# Patient Record
Sex: Male | Born: 1980 | Race: White | Hispanic: No | Smoking: Current every day smoker
Health system: Southern US, Community
[De-identification: ages and names within clinical notes are randomized; demographics above are authoritative.]

## PROBLEM LIST (undated history)

## (undated) DIAGNOSIS — J9811 Atelectasis: Secondary | ICD-10-CM

## (undated) DIAGNOSIS — S2239XA Fracture of one rib, unspecified side, initial encounter for closed fracture: Secondary | ICD-10-CM

## (undated) DIAGNOSIS — K029 Dental caries, unspecified: Secondary | ICD-10-CM

## (undated) DIAGNOSIS — Z9889 Other specified postprocedural states: Secondary | ICD-10-CM

## (undated) DIAGNOSIS — Z9289 Personal history of other medical treatment: Secondary | ICD-10-CM

## (undated) DIAGNOSIS — S2249XA Multiple fractures of ribs, unspecified side, initial encounter for closed fracture: Secondary | ICD-10-CM

## (undated) HISTORY — DX: Dental caries, unspecified: K02.9

## (undated) HISTORY — DX: Other specified postprocedural states: Z98.890

## (undated) HISTORY — DX: Fracture of one rib, unspecified side, initial encounter for closed fracture: S22.39XA

## (undated) HISTORY — DX: Atelectasis: J98.11

## (undated) HISTORY — DX: Multiple fractures of ribs, unspecified side, initial encounter for closed fracture: S22.49XA

## (undated) HISTORY — PX: SPLENECTOMY, TOTAL: SHX788

## (undated) HISTORY — PX: APPENDECTOMY: SHX54

## (undated) HISTORY — DX: Personal history of other medical treatment: Z92.89

---

## 2008-05-03 ENCOUNTER — Emergency Department: Payer: Self-pay | Admitting: Unknown Physician Specialty

## 2008-11-14 ENCOUNTER — Emergency Department: Payer: Self-pay | Admitting: Emergency Medicine

## 2008-12-07 ENCOUNTER — Emergency Department: Payer: Self-pay | Admitting: Emergency Medicine

## 2009-03-10 ENCOUNTER — Emergency Department: Payer: Self-pay | Admitting: Internal Medicine

## 2009-03-16 ENCOUNTER — Emergency Department: Payer: Self-pay | Admitting: Emergency Medicine

## 2009-05-17 ENCOUNTER — Emergency Department: Payer: Self-pay | Admitting: Internal Medicine

## 2009-09-20 ENCOUNTER — Emergency Department: Payer: Self-pay | Admitting: Emergency Medicine

## 2009-09-28 ENCOUNTER — Emergency Department: Payer: Self-pay | Admitting: Emergency Medicine

## 2009-10-27 ENCOUNTER — Emergency Department: Payer: Self-pay | Admitting: Emergency Medicine

## 2010-01-21 ENCOUNTER — Emergency Department: Payer: Self-pay | Admitting: Internal Medicine

## 2010-04-29 ENCOUNTER — Emergency Department: Payer: Self-pay | Admitting: Emergency Medicine

## 2010-05-05 ENCOUNTER — Emergency Department: Payer: Self-pay | Admitting: Emergency Medicine

## 2010-07-31 ENCOUNTER — Emergency Department: Payer: Self-pay | Admitting: Emergency Medicine

## 2010-09-27 ENCOUNTER — Emergency Department: Payer: Self-pay | Admitting: Emergency Medicine

## 2011-01-31 ENCOUNTER — Emergency Department (HOSPITAL_COMMUNITY)
Admission: EM | Admit: 2011-01-31 | Discharge: 2011-02-01 | Disposition: A | Discharge status: Home / Self Care | Payer: Self-pay | Attending: Emergency Medicine | Admitting: Emergency Medicine

## 2011-01-31 DIAGNOSIS — K029 Dental caries, unspecified: Secondary | ICD-10-CM | POA: Insufficient documentation

## 2011-01-31 DIAGNOSIS — K089 Disorder of teeth and supporting structures, unspecified: Secondary | ICD-10-CM | POA: Insufficient documentation

## 2011-03-08 ENCOUNTER — Emergency Department (HOSPITAL_COMMUNITY)
Admission: EM | Admit: 2011-03-08 | Discharge: 2011-03-08 | Disposition: A | Discharge status: Home / Self Care | Payer: Self-pay | Attending: Emergency Medicine | Admitting: Emergency Medicine

## 2011-03-08 DIAGNOSIS — I10 Essential (primary) hypertension: Secondary | ICD-10-CM | POA: Insufficient documentation

## 2011-03-08 DIAGNOSIS — K089 Disorder of teeth and supporting structures, unspecified: Secondary | ICD-10-CM | POA: Insufficient documentation

## 2011-03-08 DIAGNOSIS — K029 Dental caries, unspecified: Secondary | ICD-10-CM | POA: Insufficient documentation

## 2011-03-24 ENCOUNTER — Emergency Department: Payer: Self-pay | Admitting: Emergency Medicine

## 2011-03-25 ENCOUNTER — Emergency Department: Payer: Self-pay | Admitting: Unknown Physician Specialty

## 2011-03-28 ENCOUNTER — Emergency Department: Payer: Self-pay | Admitting: *Deleted

## 2011-03-29 ENCOUNTER — Emergency Department: Payer: Self-pay | Admitting: Internal Medicine

## 2011-03-30 ENCOUNTER — Emergency Department: Payer: Self-pay | Admitting: Internal Medicine

## 2011-04-10 ENCOUNTER — Emergency Department (HOSPITAL_COMMUNITY)
Admission: EM | Admit: 2011-04-10 | Discharge: 2011-04-10 | Discharge status: Against Medical Advice | Payer: Self-pay | Attending: Emergency Medicine | Admitting: Emergency Medicine

## 2011-04-10 ENCOUNTER — Encounter: Payer: Self-pay | Admitting: *Deleted

## 2011-04-10 ENCOUNTER — Emergency Department (HOSPITAL_COMMUNITY): Payer: Self-pay

## 2011-04-10 DIAGNOSIS — M549 Dorsalgia, unspecified: Secondary | ICD-10-CM

## 2011-04-10 DIAGNOSIS — Z0389 Encounter for observation for other suspected diseases and conditions ruled out: Secondary | ICD-10-CM | POA: Insufficient documentation

## 2011-04-10 LAB — URINALYSIS, ROUTINE W REFLEX MICROSCOPIC
Bilirubin Urine: NEGATIVE
Glucose, UA: NEGATIVE mg/dL
Ketones, ur: NEGATIVE mg/dL
Leukocytes, UA: NEGATIVE
Nitrite: NEGATIVE
Protein, ur: NEGATIVE mg/dL
Specific Gravity, Urine: 1.035 — ABNORMAL HIGH (ref 1.005–1.030)
Urobilinogen, UA: 0.2 mg/dL (ref 0.0–1.0)
pH: 6.5 (ref 5.0–8.0)

## 2011-04-10 LAB — URINE MICROSCOPIC-ADD ON

## 2011-04-10 MED ORDER — DIPHENHYDRAMINE HCL 50 MG/ML IJ SOLN: 25.0000 mg | Freq: Once | INTRAMUSCULAR | Status: DC

## 2011-04-10 MED ORDER — SODIUM CHLORIDE 0.9 % IV SOLN: 4.0000 mg | Freq: Once | INTRAVENOUS | Status: DC

## 2011-04-10 MED ORDER — MORPHINE SULFATE 4 MG/ML IJ SOLN
4.0000 mg | Freq: Once | INTRAMUSCULAR | Status: AC
  Administered 2011-04-10: 4 mg via INTRAVENOUS
  Filled 2011-04-10: qty 1

## 2011-04-10 MED ORDER — KETOROLAC TROMETHAMINE 30 MG/ML IJ SOLN
30.0000 mg | Freq: Once | INTRAMUSCULAR | Status: AC
  Administered 2011-04-10: 30 mg via INTRAVENOUS
  Filled 2011-04-10: qty 1

## 2011-04-10 MED ORDER — ONDANSETRON HCL 4 MG/2ML IJ SOLN
INTRAMUSCULAR | Status: AC
  Administered 2011-04-10: 19:00:00
  Filled 2011-04-10: qty 2

## 2011-04-10 MED ORDER — DIPHENHYDRAMINE HCL 50 MG/ML IJ SOLN
INTRAMUSCULAR | Status: AC
  Administered 2011-04-10: 19:00:00
  Filled 2011-04-10: qty 1

## 2011-04-10 MED ORDER — ONDANSETRON HCL 4 MG/2ML IJ SOLN
4.0000 mg | Freq: Once | INTRAMUSCULAR | Status: AC
  Administered 2011-04-10: 4 mg via INTRAVENOUS

## 2011-04-10 NOTE — ED Provider Notes (Signed)
History     CSN: [id number] Arrival date & time: 04/10/2011 12:19 PM   First MD Initiated Contact with Patient 04/10/11 1408      No chief complaint on file.   (Consider location/radiation/quality/duration/timing/severity/associated sxs/prior treatment) Patient is a 30 y.o. male presenting with dysuria.  Dysuria     History reviewed. No pertinent past medical history.  Past Surgical History  Procedure Date  . Splenectomy, total     History reviewed. No pertinent family history.  History  Substance Use Topics  . Smoking status: Current Everyday Smoker -- 1.0 packs/day  . Smokeless tobacco: Not on file  . Alcohol Use: No      Review of Systems  Genitourinary: Positive for dysuria.    Allergies  Review of patient's allergies indicates no known allergies.  Home Medications  No current outpatient prescriptions on file.  BP 144/99  Pulse 117  Temp(Src) 98.3 F (36.8 C) (Oral)  Resp 18  Ht 5\' 11"  (1.803 m)  Wt 145 lb (65.772 kg)  BMI 20.22 kg/m2  SpO2 100%  Physical Exam  ED Course  Procedures (including critical care time)  Labs Reviewed - No data to display No results found.   No diagnosis found.    MDM    Pt not seen, left ama           Maejor Erven A. Patrica Duel, MD 04/13/11 1454

## 2011-04-10 NOTE — ED Notes (Signed)
Pt left without IV being removed. It is unknown if pt removed IV prior to discharge. PA aware of pt AMA and unknown removal of IV

## 2011-04-10 NOTE — ED Notes (Signed)
Family at bedside. 

## 2011-04-10 NOTE — ED Notes (Signed)
Attempted to introduce self to pt. Pt left without receiving discharge instructions and without being properly discharged.

## 2011-04-10 NOTE — ED Notes (Signed)
Patient is resting comfortably. 

## 2011-04-10 NOTE — ED Provider Notes (Signed)
History     CSN: [id number] Arrival date & time: 04/10/2011 12:19 PM   First MD Initiated Contact with Patient 04/10/11 1408      No chief complaint on file.   (Consider location/radiation/quality/duration/timing/severity/associated sxs/prior treatment) HPI Comments: Patient reports the began having left flank pain earlier today.  The pain radiates to his left groin.  Pain is intermittent.  He has a family history of kidney stones.  Patient is a 30 y.o. male presenting with dysuria.  Dysuria  This is a new problem. The current episode started 6 to 12 hours ago. The problem occurs intermittently. The problem has not changed since onset.The quality of the pain is described as burning. There has been no fever. He is sexually active. There is no history of pyelonephritis. Associated symptoms include flank pain. Pertinent negatives include no chills, no nausea, no vomiting, no frequency, no hematuria, no hesitancy and no urgency. He has tried NSAIDs for the symptoms. His past medical history does not include kidney stones, urological procedure or recurrent UTIs.    History reviewed. No pertinent past medical history.  Past Surgical History  Procedure Date  . Splenectomy, total     History reviewed. No pertinent family history.  History  Substance Use Topics  . Smoking status: Current Everyday Smoker -- 1.0 packs/day  . Smokeless tobacco: Not on file  . Alcohol Use: No      Review of Systems  Constitutional: Negative for fever, chills, diaphoresis and appetite change.  Cardiovascular: Negative for chest pain.  Gastrointestinal: Negative for nausea, vomiting and abdominal distention.  Genitourinary: Positive for dysuria and flank pain. Negative for hesitancy, urgency, frequency, hematuria, discharge, penile swelling, scrotal swelling, difficulty urinating, genital sores, penile pain and testicular pain.  Musculoskeletal: Negative for arthralgias.  Hematological: Negative for  adenopathy.    Allergies  Tramadol and Morphine and related  Home Medications   Current Outpatient Rx  Name Route Sig Dispense Refill  . IBUPROFEN 200 MG PO TABS Oral Take 400 mg by mouth 3 (three) times daily as needed. For pain        BP 130/80  Pulse 79  Temp(Src) 98.4 F (36.9 C) (Oral)  Resp 18  Ht 5\' 11"  (1.803 m)  Wt 145 lb (65.772 kg)  BMI 20.22 kg/m2  SpO2 99%  Physical Exam  Constitutional: He appears well-developed and well-nourished.       Uncomfortable appearing.  HENT:  Head: Normocephalic and atraumatic.  Eyes: EOM are normal.  Neck: Normal range of motion.  Cardiovascular: Normal rate, regular rhythm and normal heart sounds.   No murmur heard. Pulmonary/Chest: Effort normal and breath sounds normal.  Abdominal: Soft. He exhibits no distension and no mass. There is no hepatosplenomegaly. There is tenderness in the left lower quadrant. There is CVA tenderness. There is no rigidity, no rebound, no guarding and no tenderness at McBurney's point. No hernia.       Left sided CVA tenderness. No right CVA tenderness.    ED Course  Procedures (including critical care time)  Labs Reviewed  URINALYSIS, ROUTINE W REFLEX MICROSCOPIC - Abnormal; Notable for the following:    Specific Gravity, Urine 1.035 (*)    Hgb urine dipstick SMALL (*)    All other components within normal limits  URINE MICROSCOPIC-ADD ON   Ct Abdomen Pelvis Wo Contrast  04/10/2011  *RADIOLOGY REPORT*  Clinical Data: Left flank pain, hematuria.  CT ABDOMEN AND PELVIS WITHOUT CONTRAST  Technique:  Multidetector CT imaging of the abdomen and  pelvis was performed following the standard protocol without intravenous contrast.  Comparison: None.  Findings: Scarring noted in the lung bases.  No effusions.  Heart is normal size.  Gallstones layering within the gallbladder.  Liver, pancreas, adrenals and kidneys have an unremarkable unenhanced appearance. There may be several accessory spleens in the  left upper quadrant. Surgical clips or coils are seen in the left upper quadrant as well.  No ureteral stones.  No hydronephrosis.  Urinary bladder is unremarkable.  Appendix is visualized and is normal. Bowel grossly unremarkable.  No free fluid, free air, or adenopathy.  Aorta is normal caliber.  IMPRESSION: No renal or ureteral stones.  No hydronephrosis.  No acute findings.  Original Report Authenticated By: Cyndie Chime, M.D.     1. Back pain   2.  Hematuria  Discussed results of the CT scan with patient.  While I was finishing patient's discharge paperwork he left AMA.      MDM  Patient having signs and symptoms of kidney stones.  Small hemoglobin present in the urine.  Therefore, a CT abdomen w/o contrast was ordered to rule out stones.  No stones observed on CT.          Jon Stafford 04/14/11 0120

## 2011-04-10 NOTE — ED Notes (Addendum)
Pt waiting to be seen by provider. Pt undressed and placed in patient gown

## 2011-04-10 NOTE — ED Notes (Signed)
Reports lower back and abd pain, pain with urination. Generalized leg weakness, denies penile discharge

## 2011-04-14 NOTE — ED Provider Notes (Signed)
Medical screening examination/treatment/procedure(s) were performed by non-physician practitioner and as supervising physician I was immediately available for consultation/collaboration.   Abelardo Seidner A. Patrica Duel, MD 04/14/11 1512

## 2011-10-09 LAB — COMPREHENSIVE METABOLIC PANEL
BUN: 13 mg/dL (ref 7–18)
Bilirubin,Total: 2.2 mg/dL — ABNORMAL HIGH (ref 0.2–1.0)
Calcium, Total: 8.8 mg/dL (ref 8.5–10.1)
Creatinine: 1.23 mg/dL (ref 0.60–1.30)
EGFR (African American): >60
EGFR (Non-African Amer.): >60
SGOT(AST): 20 U/L (ref 15–37)
Total Protein: 7.7 g/dL (ref 6.4–8.2)

## 2011-10-09 LAB — URINALYSIS, COMPLETE
Bilirubin,UR: NEGATIVE
Ketone: NEGATIVE
RBC,UR: NONE SEEN /HPF (ref 0–5)

## 2011-10-09 LAB — CBC
HGB: 14.8 g/dL (ref 13.0–18.0)
MCH: 30.7 pg (ref 26.0–34.0)
MCV: 92 fL (ref 80–100)
Platelet: 275 10*3/uL (ref 150–440)
RBC: 4.83 10*6/uL (ref 4.40–5.90)
RDW: 12.8 % (ref 11.5–14.5)

## 2011-10-09 LAB — DRUG SCREEN, URINE
Barbiturates, Ur Screen: NEGATIVE (ref ?–200)
Cocaine Metabolite,Ur ~~LOC~~: NEGATIVE (ref ?–300)
MDMA (Ecstasy)Ur Screen: NEGATIVE (ref ?–500)
Methadone, Ur Screen: NEGATIVE (ref ?–300)
Tricyclic, Ur Screen: NEGATIVE (ref ?–1000)

## 2011-10-09 LAB — LIPASE, BLOOD: Lipase: 73 U/L (ref 73–393)

## 2011-10-10 ENCOUNTER — Observation Stay: Payer: Self-pay | Admitting: Surgery

## 2011-10-11 LAB — URINE CULTURE

## 2011-10-11 LAB — PATHOLOGY REPORT

## 2011-10-15 LAB — CULTURE, BLOOD (SINGLE)

## 2012-06-14 ENCOUNTER — Emergency Department: Payer: Self-pay | Admitting: Emergency Medicine

## 2012-06-14 LAB — URINALYSIS, COMPLETE
Bilirubin,UR: NEGATIVE
Leukocyte Esterase: NEGATIVE
Nitrite: NEGATIVE
Specific Gravity: 1.028 (ref 1.003–1.030)
WBC UR: 3 /HPF (ref 0–5)

## 2012-12-03 ENCOUNTER — Emergency Department: Payer: Self-pay | Admitting: Internal Medicine

## 2012-12-03 LAB — BASIC METABOLIC PANEL
Calcium, Total: 9 mg/dL (ref 8.5–10.1)
Co2: 29 mmol/L (ref 21–32)
Potassium: 4.3 mmol/L (ref 3.5–5.1)
Sodium: 139 mmol/L (ref 136–145)

## 2012-12-03 LAB — CBC
HCT: 43.9 % (ref 40.0–52.0)
HGB: 15.3 g/dL (ref 13.0–18.0)
MCV: 92 fL (ref 80–100)
Platelet: 313 10*3/uL (ref 150–440)
RDW: 13.5 % (ref 11.5–14.5)
WBC: 10.8 10*3/uL — ABNORMAL HIGH (ref 3.8–10.6)

## 2012-12-03 LAB — URINALYSIS, COMPLETE
Bilirubin,UR: NEGATIVE
Glucose,UR: NEGATIVE mg/dL (ref 0–75)
Ketone: NEGATIVE
Nitrite: NEGATIVE
RBC,UR: NONE SEEN /HPF (ref 0–5)
Specific Gravity: 1.008 (ref 1.003–1.030)
WBC UR: NONE SEEN /HPF (ref 0–5)

## 2013-01-11 ENCOUNTER — Emergency Department: Payer: Self-pay | Admitting: Emergency Medicine

## 2013-01-11 LAB — URINALYSIS, COMPLETE
Bacteria: NONE SEEN
Glucose,UR: NEGATIVE mg/dL (ref 0–75)
Ketone: NEGATIVE
Leukocyte Esterase: NEGATIVE
Nitrite: NEGATIVE
Squamous Epithelial: NONE SEEN

## 2013-01-11 LAB — BASIC METABOLIC PANEL
Anion Gap: 6 — ABNORMAL LOW (ref 7–16)
BUN: 10 mg/dL (ref 7–18)
Calcium, Total: 8.8 mg/dL (ref 8.5–10.1)
Chloride: 106 mmol/L (ref 98–107)
EGFR (African American): >60
Osmolality: 279 (ref 275–301)

## 2013-01-11 LAB — CBC: MCH: 32.1 pg (ref 26.0–34.0)

## 2013-09-23 ENCOUNTER — Emergency Department: Payer: Self-pay | Admitting: Emergency Medicine

## 2013-09-23 LAB — BASIC METABOLIC PANEL
Anion Gap: 5 — ABNORMAL LOW (ref 7–16)
BUN: 11 mg/dL (ref 7–18)
CO2: 27 mmol/L (ref 21–32)
CREATININE: 0.93 mg/dL (ref 0.60–1.30)
Calcium, Total: 8.7 mg/dL (ref 8.5–10.1)
Chloride: 107 mmol/L (ref 98–107)
EGFR (African American): >60
GLUCOSE: 100 mg/dL — AB (ref 65–99)
Osmolality: 277 (ref 275–301)
POTASSIUM: 3.6 mmol/L (ref 3.5–5.1)
SODIUM: 139 mmol/L (ref 136–145)

## 2013-09-23 LAB — CBC
HCT: 44.5 % (ref 40.0–52.0)
HGB: 15 g/dL (ref 13.0–18.0)
MCH: 30.6 pg (ref 26.0–34.0)
MCHC: 33.7 g/dL (ref 32.0–36.0)
MCV: 91 fL (ref 80–100)
Platelet: 245 10*3/uL (ref 150–440)
RBC: 4.9 10*6/uL (ref 4.40–5.90)
RDW: 13.8 % (ref 11.5–14.5)
WBC: 15 10*3/uL — AB (ref 3.8–10.6)

## 2013-09-23 LAB — TROPONIN I

## 2013-09-24 LAB — TROPONIN I

## 2013-09-24 LAB — D-DIMER(ARMC): D-Dimer: 89 ng/ml

## 2013-09-28 ENCOUNTER — Emergency Department: Payer: Self-pay | Admitting: Emergency Medicine

## 2013-09-28 LAB — CBC WITH DIFFERENTIAL/PLATELET
BASOS ABS: 0.1 10*3/uL (ref 0.0–0.1)
Basophil %: 0.5 %
EOS PCT: 4.3 %
Eosinophil #: 0.5 10*3/uL (ref 0.0–0.7)
HCT: 45.6 % (ref 40.0–52.0)
HGB: 15.6 g/dL (ref 13.0–18.0)
LYMPHS ABS: 3.7 10*3/uL — AB (ref 1.0–3.6)
Lymphocyte %: 30.5 %
MCH: 31.6 pg (ref 26.0–34.0)
MCHC: 34.3 g/dL (ref 32.0–36.0)
MCV: 92 fL (ref 80–100)
MONOS PCT: 9.8 %
Monocyte #: 1.2 x10 3/mm — ABNORMAL HIGH (ref 0.2–1.0)
NEUTROS PCT: 54.9 %
Neutrophil #: 6.7 10*3/uL — ABNORMAL HIGH (ref 1.4–6.5)
Platelet: 244 10*3/uL (ref 150–440)
RBC: 4.96 10*6/uL (ref 4.40–5.90)
RDW: 14.1 % (ref 11.5–14.5)
WBC: 12.1 10*3/uL — ABNORMAL HIGH (ref 3.8–10.6)

## 2013-09-28 LAB — TROPONIN I

## 2013-09-28 LAB — D-DIMER(ARMC)

## 2013-09-28 LAB — COMPREHENSIVE METABOLIC PANEL
ALK PHOS: 74 U/L
ANION GAP: 8 (ref 7–16)
AST: 26 U/L (ref 15–37)
Albumin: 4 g/dL (ref 3.4–5.0)
BUN: 14 mg/dL (ref 7–18)
Bilirubin,Total: 0.7 mg/dL (ref 0.2–1.0)
CREATININE: 0.85 mg/dL (ref 0.60–1.30)
Calcium, Total: 8.8 mg/dL (ref 8.5–10.1)
Chloride: 106 mmol/L (ref 98–107)
Co2: 26 mmol/L (ref 21–32)
EGFR (Non-African Amer.): >60
Glucose: 124 mg/dL — ABNORMAL HIGH (ref 65–99)
Osmolality: 281 (ref 275–301)
POTASSIUM: 3.4 mmol/L — AB (ref 3.5–5.1)
SGPT (ALT): 28 U/L (ref 12–78)
SODIUM: 140 mmol/L (ref 136–145)
TOTAL PROTEIN: 7.7 g/dL (ref 6.4–8.2)

## 2013-10-03 ENCOUNTER — Encounter: Payer: Self-pay | Admitting: *Deleted

## 2013-10-04 ENCOUNTER — Encounter: Payer: Self-pay | Admitting: *Deleted

## 2013-10-04 ENCOUNTER — Ambulatory Visit: Payer: Self-pay | Admitting: Cardiovascular Disease

## 2013-10-06 ENCOUNTER — Emergency Department: Payer: Self-pay | Admitting: Emergency Medicine

## 2013-10-07 ENCOUNTER — Ambulatory Visit: Payer: Self-pay | Admitting: Cardiovascular Disease

## 2013-11-02 ENCOUNTER — Emergency Department: Payer: Self-pay | Admitting: Emergency Medicine

## 2014-06-07 ENCOUNTER — Emergency Department: Payer: Self-pay | Admitting: Emergency Medicine

## 2014-09-28 NOTE — Op Note (Signed)
PATIENT NAME:  Jon Stafford, [name] MR#:  [id number] DATE OF BIRTH:  02-Apr-1981  DATE OF PROCEDURE:  10/10/2011  PREOPERATIVE DIAGNOSIS: Acute appendicitis.   POSTOPERATIVE DIAGNOSIS: Acute appendicitis, suppurative, pelvic.  OPERATION PERFORMED: Laparoscopic appendectomy.   SURGEON: Claude MangesWilliam F. Diyari Cherne, M.D.   ANESTHESIA: General.   PROCEDURE IN DETAIL: The patient was placed supine on the operating room table and prepped and draped in the usual sterile fashion. A Hassan cannula was introduced amidst horizontal mattress sutures of 0 Vicryl, in the supraumbilical linea alba, and a 15 mmHg CO2 pneumoperitoneum was created. Two additional 5 mm trocars were placed under direct visualization. There was exudate present at the pelvic brim and the appendix was between the terminal ileum and the true pelvis and the terminal ileum was plastered down on top of it. Blunt dissection allowed the appendix to be visualized and dissected out completely and lifted on its mesoappendix. The mesoappendix was taken down with the Harmonic scalpel up to the appendiceal base. The appendectomy was performed with an Endo GIA stapling device right where the appendix joined the cecum. Hemostasis was excellent. The appendix was placed in an Endo Catch bag and extracted from the abdomen via the supraumbilical port. The right lower quadrant and pelvis were irrigated with warm normal saline and this was suctioned clear. The peritoneum was desufflated and decannulated after the omentum was brought down over top of the operative area and the terminal ileum and cecum were replaced in their original positions. The linea alba was closed with a running 0 PDS suture and the previously placed Vicryls were          tied one to another. All three skin sites were closed with subcuticular 5-0 Monocryl and suture strips. The patient tolerated the procedure well. There were no complications. ____________________________ Claude MangesWilliam F. Maury Bamba,  MD wfm:slb D: 10/10/2011 00:34:09 ET T: 10/10/2011 10:14:12 ET JOB#: [id number]  cc: Claude MangesWilliam F. Halo Shevlin, MD, <Dictator> Claude MangesWILLIAM F Babita Amaker MD ELECTRONICALLY SIGNED 10/10/2011 20:00
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
## 2015-06-02 ENCOUNTER — Emergency Department: Payer: Self-pay

## 2015-06-02 ENCOUNTER — Encounter: Payer: Self-pay | Admitting: Emergency Medicine

## 2015-06-02 DIAGNOSIS — R0981 Nasal congestion: Secondary | ICD-10-CM | POA: Insufficient documentation

## 2015-06-02 DIAGNOSIS — J3489 Other specified disorders of nose and nasal sinuses: Secondary | ICD-10-CM | POA: Insufficient documentation

## 2015-06-02 DIAGNOSIS — R51 Headache: Secondary | ICD-10-CM | POA: Insufficient documentation

## 2015-06-02 DIAGNOSIS — F17200 Nicotine dependence, unspecified, uncomplicated: Secondary | ICD-10-CM | POA: Insufficient documentation

## 2015-06-02 DIAGNOSIS — R05 Cough: Secondary | ICD-10-CM | POA: Insufficient documentation

## 2015-06-02 NOTE — ED Notes (Signed)
Pt presents to ED with frequent cough congestion and sinus pressure for the past month. Pt reports 3 days ago he has developed a headache that he cant get rid of with otc medications. Headache is mostly behind his eyes and sinuses. Pt alert with no distress noted at this time.

## 2015-06-03 ENCOUNTER — Emergency Department
Admission: EM | Admit: 2015-06-03 | Discharge: 2015-06-03 | Discharge status: Against Medical Advice | Payer: Self-pay | Attending: Emergency Medicine | Admitting: Emergency Medicine

## 2015-09-02 ENCOUNTER — Encounter: Payer: Self-pay | Admitting: *Deleted

## 2015-09-02 ENCOUNTER — Emergency Department
Admission: EM | Admit: 2015-09-02 | Discharge: 2015-09-02 | Disposition: A | Discharge status: Home / Self Care | Payer: Self-pay | Attending: Emergency Medicine | Admitting: Emergency Medicine

## 2015-09-02 DIAGNOSIS — J9811 Atelectasis: Secondary | ICD-10-CM | POA: Insufficient documentation

## 2015-09-02 DIAGNOSIS — Y93G3 Activity, cooking and baking: Secondary | ICD-10-CM | POA: Insufficient documentation

## 2015-09-02 DIAGNOSIS — Y990 Civilian activity done for income or pay: Secondary | ICD-10-CM | POA: Insufficient documentation

## 2015-09-02 DIAGNOSIS — T23201A Burn of second degree of right hand, unspecified site, initial encounter: Secondary | ICD-10-CM | POA: Insufficient documentation

## 2015-09-02 DIAGNOSIS — Z9889 Other specified postprocedural states: Secondary | ICD-10-CM | POA: Insufficient documentation

## 2015-09-02 DIAGNOSIS — F17200 Nicotine dependence, unspecified, uncomplicated: Secondary | ICD-10-CM | POA: Insufficient documentation

## 2015-09-02 DIAGNOSIS — X12XXXA Contact with other hot fluids, initial encounter: Secondary | ICD-10-CM | POA: Insufficient documentation

## 2015-09-02 DIAGNOSIS — Z9289 Personal history of other medical treatment: Secondary | ICD-10-CM | POA: Insufficient documentation

## 2015-09-02 DIAGNOSIS — Y929 Unspecified place or not applicable: Secondary | ICD-10-CM | POA: Insufficient documentation

## 2015-09-02 MED ORDER — SILVER SULFADIAZINE 1 % EX CREA: TOPICAL_CREAM | CUTANEOUS | Status: AC
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
## 2015-09-02 NOTE — Discharge Instructions (Signed)
Second-Degree Burn °A second-degree burn affects the 2 outer layers of skin. The outer layer (epidermis) and the layer underneath it (dermis) are both burned. Another name for this type of burn is a partial thickness burn. A second-degree burn may be called minor or major. This depends on the size of the burn. It also depends on what parts of the skin are burned. Minor burns may be treated with first aid. Major burns are a medical emergency. °A second-degree burn is worse than a first-degree burn, but not as bad as a third-degree burn. A first-degree burn affects only the epidermis. A third-degree burn goes through all the layers of skin. A second-degree burn usually heals in 3 to 4 weeks. A minor second-degree burn usually does not leave a scar. Deeper second-degree burns may lead to scarring of the skin or contractures over joints. Contractures are scars that form over joints and may lead to reduced mobility at those joints. °CAUSES °· Heat (thermal) injury. This happens when skin comes in contact with something very hot. It could be a flame, a hot object, hot liquid, or steam. Most second-degree burns are thermal injuries. °· Radiation. Sunlight is one type of radiation that can burn the skin. Another type of radiation is used to heat food. Radiation is also used to treat some diseases, such as cancer. All types of radiation can burn the skin. Sunlight usually causes a first-degree burn. Radiation used for heating food or treating a disease can cause a second-degree burn. °· Electricity. Electrical burns can cause more damage under the skin than on the surface. They should always be treated as major burns. °· Chemicals. Many chemicals can burn the skin. The burn should be flushed with cool water and checked by an emergency caregiver. °SYMPTOMS °Symptoms of second-degree burns include: °· Severe pain. °· Extreme tenderness. °· Deep redness. °· Blistered skin. °· Skin that has changed color. It might look blotchy,  wet, or shiny. °· Swelling. °TREATMENT °Some second-degree burns may need to be treated in a hospital. These include major burns, electrical burns, and chemical burns. Many other second-degree burns can be treated with regular first aid, such as: °· Cooling the burn. Use cool, germ-free (sterile) salt water. Place the burned area of skin into a tub of water, or cover the burned area with clean, wet towels. °· Taking pain medicine. °· Removing the dead skin from broken blisters. A trained caregiver may do this. Do not pop blisters. °· Gently washing your skin with mild soap. °· Covering the burned area with a cream. Silver sulfadiazine is a cream for burns. An antibiotic cream, such as bacitracin, may also be used to fight infection. Do not use other ointments or creams unless your caregiver says it is okay. °· Protecting the burn with a sterile, non-sticky bandage. °· Bandaging fingers and toes separately. This keeps them from sticking together. °· Taking an antibiotic. This can help prevent infection. °· Getting a tetanus shot. °HOME CARE INSTRUCTIONS °Medication °· Take any medicine prescribed by your caregiver. Follow the directions carefully. °· Ask your caregiver if you can take over-the-counter medicine to relieve pain and swelling. Do not give aspirin to children. °· Make sure your caregiver knows about all other medicines you take. This includes over-the-counter medicines. °Burn care °· You will need to change the bandage on your burn. You may need to do this 2 or 3 times each day. °¨ Gently clean the burned area. °¨ Put ointment on it. °¨ Cover the burn with a sterile bandage. °·   For some deeper burns or burns that cover a large area, compression garments may be prescribed. These garments can help minimize scarring and protect your mobility. °· Do not put butter or oil on your skin. Use only the cream prescribed by your caregiver. °· Do not put ice on your burn. °· Do not break blisters on your  skin. °· Keep the bandaged area dry. You might need to take a sponge bath for awhile. Ask your caregiver when you can take a shower or a tub bath again. °· Do not scratch an itchy burn. Your caregiver may give you medicine to relieve very bad itching. °· Infection is a big danger after a second-degree burn. Tell your caregiver right away if you have signs of infection, such as: °¨ Redness or changing color in the burned area. °¨ Fluid leaking from the burn. °¨ Swelling in the burn area. °¨ A bad smell coming from the wound. °Follow-up °· Keep all follow-up appointments. This is important. This is how your caregiver can tell if your treatment is working. °· Protect your burn from sunlight. Use sunscreen whenever you go outside. Burned areas may be sensitive to the sun for up to 1 year. Exposure to the sun may also cause permanent darkening of scars. °SEEK MEDICAL CARE IF: °· You have any questions about medicines. °· You have any questions about your treatment. °· You wonder if it is okay to do a particular activity. °· You develop a fever of more than 100.5° F (38.1° C). °SEEK IMMEDIATE MEDICAL CARE IF: °· You think your burn might be infected. It may change color, become red, leak fluid, swell, or smell bad. °· You develop a fever of more than 102° F (38.9° C). °  °This information is not intended to replace advice given to you by your health care provider. Make sure you discuss any questions you have with your health care provider. °  °Document Released: 10/25/2010 Document Revised: 08/15/2011 Document Reviewed: 10/25/2010 °Elsevier Interactive Patient Education ©2016 [employer] ° °

## 2015-09-02 NOTE — ED Provider Notes (Signed)
[facility] Emergency Department Provider Note  ____________________________________________  Time seen: Approximately 4:52 PM  I have reviewed the triage vital signs and the nursing notes.   HISTORY  Chief Complaint Hand Burn    HPI Jon Stafford is a 35 y.o. male who presents emergency department complaining of burn to his right hand. Patient states that he was at work cooking something when it splattered on his right hand causing a burn. He states that the pain was minimal initially. He endorses some blistering to the top of his right hand. Patient states that this morning it ruptured and the pain increased. He was concerned that he may have an infection. He denies any numbness or tingling distal to injury. He denies any fevers or chills.   Past Medical History  Diagnosis Date  . History of blood transfusion   . Broken ribs   . H/O chest tube placement   . Dental caries   . Collapse of left lung     There are no active problems to display for this patient.   Past Surgical History  Procedure Laterality Date  . Splenectomy, total    . Appendectomy      Current Outpatient Rx  Name  Route  Sig  Dispense  Refill  . ibuprofen (ADVIL,MOTRIN) 200 MG tablet   Oral   Take 400 mg by mouth 3 (three) times daily as needed. For pain           . silver sulfADIAZINE (SILVADENE) 1 % cream      Apply to affected area daily   50 g   1     Allergies Tramadol and Morphine and related  History reviewed. No pertinent family history.  Social History Social History  Substance Use Topics  . Smoking status: Current Every Day Smoker -- 1.00 packs/day  . Smokeless tobacco: None  . Alcohol Use: No     Review of Systems  Constitutional: No fever/chills Cardiovascular: no chest pain. Respiratory: no cough. No SOB. Musculoskeletal: No hand pain. Skin: Negative for rash. Positive for burn to dorsal aspect right hand. Neurological: Negative for  headaches, focal weakness or numbness. 10-point ROS otherwise negative.  ____________________________________________   PHYSICAL EXAM:  VITAL SIGNS: ED Triage Vitals  Enc Vitals Group     BP 09/02/15 1630 120/72 mmHg     Pulse Rate 09/02/15 1630 92     Resp 09/02/15 1630 18     Temp 09/02/15 1630 97.8 F (36.6 C)     Temp Source 09/02/15 1630 Oral     SpO2 09/02/15 1630 98 %     Weight 09/02/15 1630 147 lb (66.679 kg)     Height 09/02/15 1630  (1.803 m)     Head Cir --      Peak Flow --      Pain Score 09/02/15 1630 8     Pain Loc --      Pain Edu? --      Excl. in GC? --      Constitutional: Alert and oriented. Well appearing and in no acute distress. Eyes: Conjunctivae are normal. PERRL. EOMI. Head: Atraumatic. Cardiovascular: Normal rate, regular rhythm. Normal S1 and S2.  Good peripheral circulation. Respiratory: Normal respiratory effort without tachypnea or retractions. Lungs CTAB. Musculoskeletal: Full range of motion to right wrist and right hand. Good cap refill 5 digits. Sensation intact 5 digits. Neurologic:  Normal speech and language. No gross focal neurologic deficits are appreciated.  Skin:  Skin is warm, dry and intact. No rash noted. Healing burn is noted to the dorsal aspect of the right hand. Blister has popped to the dorsal aspect. No surrounding erythema or edema. No signs of infection. No drainage noted. Area is tender to palpation. Psychiatric: Mood and affect are normal. Speech and behavior are normal. Patient exhibits appropriate insight and judgement.   ____________________________________________   LABS (all labs ordered are listed, but only abnormal results are displayed)  Labs Reviewed - No data to display ____________________________________________  EKG   ____________________________________________  RADIOLOGY   No results found.  ____________________________________________    PROCEDURES  Procedure(s) performed:        Medications - No data to display   ____________________________________________   INITIAL IMPRESSION / ASSESSMENT AND PLAN / ED COURSE  Pertinent labs & imaging results that were available during my care of the patient were reviewed by me and considered in my medical decision making (see chart for details).  Patient's diagnosis is consistent with second-degree burn to the right hand. No signs of infection at this time. Patient is encouraged to keep area clean and covered.. Patient will be discharged home with prescriptions for Silvadene cream. Patient is to follow up with primary care provider if symptoms persist past this treatment course. Patient is given ED precautions to return to the ED for any worsening or new symptoms.     ____________________________________________  FINAL CLINICAL IMPRESSION(S) / ED DIAGNOSES  Final diagnoses:  Burn of hand, right, second degree, initial encounter      NEW MEDICATIONS STARTED DURING THIS VISIT:  New Prescriptions   SILVER SULFADIAZINE (SILVADENE) 1 % CREAM    Apply to affected area daily        This chart was dictated using voice recognition software/Dragon. Despite best efforts to proofread, errors can occur which can change the meaning. Any change was purely unintentional.    Racheal PatchesJonathan D Justyce Yeater, PA-C 09/02/15 1701  Rockne MenghiniAnne-Caroline Norman, MD 09/02/15 [phone]

## 2015-09-02 NOTE — ED Notes (Addendum)
States he was at work at [employer] and he pulled a cup of hot cheese out of the microwave and it melted on his right hand, pt does not want to file workers comp

## 2015-09-04 ENCOUNTER — Emergency Department
Admission: EM | Admit: 2015-09-04 | Discharge: 2015-09-04 | Disposition: A | Discharge status: Home / Self Care | Payer: Self-pay | Attending: Emergency Medicine | Admitting: Emergency Medicine

## 2015-09-04 ENCOUNTER — Encounter: Payer: Self-pay | Admitting: Emergency Medicine

## 2015-09-04 DIAGNOSIS — Y929 Unspecified place or not applicable: Secondary | ICD-10-CM | POA: Insufficient documentation

## 2015-09-04 DIAGNOSIS — F17200 Nicotine dependence, unspecified, uncomplicated: Secondary | ICD-10-CM | POA: Insufficient documentation

## 2015-09-04 DIAGNOSIS — W268XXA Contact with other sharp object(s), not elsewhere classified, initial encounter: Secondary | ICD-10-CM | POA: Insufficient documentation

## 2015-09-04 DIAGNOSIS — Y9389 Activity, other specified: Secondary | ICD-10-CM | POA: Insufficient documentation

## 2015-09-04 DIAGNOSIS — Z8781 Personal history of (healed) traumatic fracture: Secondary | ICD-10-CM | POA: Insufficient documentation

## 2015-09-04 DIAGNOSIS — Z23 Encounter for immunization: Secondary | ICD-10-CM | POA: Insufficient documentation

## 2015-09-04 DIAGNOSIS — Y999 Unspecified external cause status: Secondary | ICD-10-CM | POA: Insufficient documentation

## 2015-09-04 DIAGNOSIS — S61011A Laceration without foreign body of right thumb without damage to nail, initial encounter: Secondary | ICD-10-CM | POA: Insufficient documentation

## 2015-09-04 DIAGNOSIS — Z9889 Other specified postprocedural states: Secondary | ICD-10-CM | POA: Insufficient documentation

## 2015-09-04 DIAGNOSIS — Z791 Long term (current) use of non-steroidal anti-inflammatories (NSAID): Secondary | ICD-10-CM | POA: Insufficient documentation

## 2015-09-04 MED ORDER — LIDOCAINE HCL (PF) 1 % IJ SOLN
10.0000 mL | Freq: Once | INTRAMUSCULAR | Status: DC
  Filled 2015-09-04: qty 10

## 2015-09-04 MED ORDER — TETANUS-DIPHTH-ACELL PERTUSSIS 5-2.5-18.5 LF-MCG/0.5 IM SUSP
0.5000 mL | Freq: Once | INTRAMUSCULAR | Status: AC
  Administered 2015-09-04: 0.5 mL via INTRAMUSCULAR
  Filled 2015-09-04: qty 0.5

## 2015-09-04 NOTE — ED Notes (Signed)
Patient presents to the ED with laceration to his right thumb today.  Patient reports cutting thumb with a piece of plexi-glass.  Edges of cut are approximated and bleeding is controlled.  Area is v shaped and about 1inch all together.  Patient is in no obvious distress at this time.

## 2015-09-04 NOTE — ED Provider Notes (Signed)
[facility] Emergency Department Provider Note  ____________________________________________  Time seen: Approximately 4:13 PM  I have reviewed the triage vital signs and the nursing notes.   HISTORY  Chief Complaint Extremity Laceration   HPI Jon Stafford is a 35 y.o. male is here with complaint of laceration to his right thumb that happened today. Patient states he was cutting a piece of plexiglass when he cut his thumb. He is unsure of when last time he had tetanus immunization but is sure that it has been 5 years or more.   Past Medical History  Diagnosis Date  . History of blood transfusion   . Broken ribs   . H/O chest tube placement   . Dental caries   . Collapse of left lung     There are no active problems to display for this patient.   Past Surgical History  Procedure Laterality Date  . Splenectomy, total    . Appendectomy      Current Outpatient Rx  Name  Route  Sig  Dispense  Refill  . ibuprofen (ADVIL,MOTRIN) 200 MG tablet   Oral   Take 400 mg by mouth 3 (three) times daily as needed. For pain           . silver sulfADIAZINE (SILVADENE) 1 % cream      Apply to affected area daily   50 g   1     Allergies Tramadol and Morphine and related  History reviewed. No pertinent family history.  Social History Social History  Substance Use Topics  . Smoking status: Current Every Day Smoker -- 1.00 packs/day  . Smokeless tobacco: None  . Alcohol Use: No    Review of Systems Constitutional: No fever/chills Cardiovascular: Denies chest pain. Respiratory: Denies shortness of breath. Musculoskeletal: Positive for thumb pain right hand. Skin: Laceration right thumb 10-point ROS otherwise negative.  ____________________________________________   PHYSICAL EXAM:  VITAL SIGNS: ED Triage Vitals  Enc Vitals Group     BP 09/04/15 1533 122/79 mmHg     Pulse Rate 09/04/15 1533 98     Resp 09/04/15 1533 20     Temp  09/04/15 1533 98.6 F (37 C)     Temp Source 09/04/15 1533 Oral     SpO2 09/04/15 1533 98 %     Weight 09/04/15 1533 147 lb (66.679 kg)     Height 09/04/15 1533 5\' 11"  (1.803 m)     Head Cir --      Peak Flow --      Pain Score 09/04/15 1534 9     Pain Loc --      Pain Edu? --      Excl. in GC? --     Constitutional: Alert and oriented. Well appearing and in no acute distress. Eyes: Conjunctivae are normal. PERRL. EOMI. Head: Atraumatic. Nose: No congestion/rhinnorhea. Neck: No stridor.   Respiratory: Normal respiratory effort.  Musculoskeletal: Moves upper and lower extremities without difficulty. Normal gait was noted. Neurologic:  Normal speech and language. No gross focal neurologic deficits are appreciated. No gait instability. Skin:  Skin is warm, dry. There is a 1 cm superficial flap laceration to the medial aspect of the right thumb dorsal aspect. There is no evidence of foreign body and there is no active bleeding. Patient is able to move thumb without any disruption of skin or bleeding. Motor sensory function intact. Psychiatric: Mood and affect are normal. Speech and behavior are normal.  ____________________________________________   LABS (  all labs ordered are listed, but only abnormal results are displayed)  Labs Reviewed - No data to display   PROCEDURES  Procedure(s) performed: LACERATION REPAIR Performed by: Tommi Rumps Authorized by: Tommi Rumps Consent: Verbal consent obtained. Risks and benefits: risks, benefits and alternatives were discussed Consent given by: patient Patient identity confirmed: provided demographic data Prepped and Draped in normal sterile fashion Wound explored  Laceration Location: Right thumb  Laceration Length: 1.0 cm  No Foreign Bodies seen or palpated Amount of cleaning: standard  Skin closure: Steri-Strips and benzoin   Patient tolerance: Patient tolerated the procedure well with no immediate  complications.  Critical Care performed: No  ____________________________________________   INITIAL IMPRESSION / ASSESSMENT AND PLAN / ED COURSE  Pertinent labs & imaging results that were available during my care of the patient were reviewed by me and considered in my medical decision making (see chart for details).  She was given instructions on how to keep the area clean. He is to remain out of work due to the fact he works around in [employer]. He is follow-up with [facility] or his regular  doctor if any continued problems. ____________________________________________   FINAL CLINICAL IMPRESSION(S) / ED DIAGNOSES  Final diagnoses:  Laceration of right thumb, initial encounter      Tommi Rumps, PA-C 09/04/15 1701  Myrna Blazer, MD 09/04/15 2358

## 2015-09-04 NOTE — Discharge Instructions (Signed)
But Steri-Strips fall off on their own. Keep clean and dry. Tylenol or ibuprofen as needed for pain. [facility] doctor [facility] if any continued problems.

## 2015-11-10 ENCOUNTER — Emergency Department
Admission: EM | Admit: 2015-11-10 | Discharge: 2015-11-10 | Disposition: A | Discharge status: Home / Self Care | Payer: Self-pay | Attending: Emergency Medicine | Admitting: Emergency Medicine

## 2015-11-10 ENCOUNTER — Emergency Department: Payer: Self-pay

## 2015-11-10 DIAGNOSIS — Y999 Unspecified external cause status: Secondary | ICD-10-CM | POA: Insufficient documentation

## 2015-11-10 DIAGNOSIS — Y9389 Activity, other specified: Secondary | ICD-10-CM | POA: Insufficient documentation

## 2015-11-10 DIAGNOSIS — W260XXA Contact with knife, initial encounter: Secondary | ICD-10-CM | POA: Insufficient documentation

## 2015-11-10 DIAGNOSIS — S61012A Laceration without foreign body of left thumb without damage to nail, initial encounter: Secondary | ICD-10-CM | POA: Insufficient documentation

## 2015-11-10 DIAGNOSIS — F17200 Nicotine dependence, unspecified, uncomplicated: Secondary | ICD-10-CM | POA: Insufficient documentation

## 2015-11-10 DIAGNOSIS — Z791 Long term (current) use of non-steroidal anti-inflammatories (NSAID): Secondary | ICD-10-CM | POA: Insufficient documentation

## 2015-11-10 DIAGNOSIS — Y92000 Kitchen of unspecified non-institutional (private) residence as  the place of occurrence of the external cause: Secondary | ICD-10-CM | POA: Insufficient documentation

## 2015-11-10 MED ORDER — HYDROCODONE-ACETAMINOPHEN 5-325 MG PO TABS: 1.0000 | ORAL_TABLET | Freq: Four times a day (QID) | ORAL | Status: DC | PRN

## 2015-11-10 MED ORDER — CEPHALEXIN 500 MG PO CAPS: 500.0000 mg | ORAL_CAPSULE | Freq: Three times a day (TID) | ORAL | Status: DC

## 2015-11-10 NOTE — ED Notes (Addendum)
Pt reports laceration to left thumb - Pt states he was at work and was cutting up fruit and lacerated the tip of his left thumb - Pt reports that a small area of thumb is "missing" - This Clinical research associatewriter ask pt if this was a workers Education officer, environmentalcomp claim and he stated no he would not be filing workers comp

## 2015-11-10 NOTE — ED Notes (Signed)
Pt in with small avulsion to tip of left thumb, small amt of bleeding noted.

## 2015-11-10 NOTE — Discharge Instructions (Signed)
Please follow up with the hand specialist if you have any redness, swelling, increasing pain, numbness, tingling or loss of range of motion of your finger.   Nonsutured Laceration Care A laceration is a cut that goes through all layers of the skin and extends into the tissue that is right under the skin. This type of cut is usually stitched up (sutured) or closed with tape (adhesive strips) or skin glue shortly after the injury happens. However, if the wound is dirty or if several hours pass before medical treatment is provided, it is likely that germs (bacteria) will enter the wound. Closing a laceration after bacteria have entered it increases the risk of infection. In these cases, your health care provider may leave the laceration open (nonsutured) and cover it with a bandage. This type of treatment helps prevent infection and allows the wound to heal from the deepest layer of tissue damage up to the surface. An open fracture is a type of injury that may involve nonsutured lacerations. An open fracture is a break in a bone that happens along with one or more lacerations through the skin that is near the fracture site. HOW TO CARE FOR YOUR NONSUTURED LACERATION  Take or apply over-the-counter and prescription medicines only as told by your health care provider.  If you were prescribed an antibiotic medicine, take or apply it as told by your health care provider. Do not stop using the antibiotic even if your condition improves.  Clean the wound one time each day or as told by your health care provider.  Wash the wound with mild soap and water.  Rinse the wound with water to remove all soap.  Pat your wound dry with a clean towel. Do not rub the wound.  Do not inject anything into the wound unless your health care provider told you to.  Change any bandages (dressings) as told by your health care provider. This includes changing the dressing if it gets wet, dirty, or starts to smell bad.  Keep  the dressing dry until your health care provider says it can be removed. Do not take baths, swim, or do anything that puts your wound underwater until your health care provider approves.  Raise (elevate) the injured area above the level of your heart while you are sitting or lying down, if possible.  Do not scratch or pick at the wound.  Check your wound every day for signs of infection. Watch for:  Redness, swelling, or pain.  Fluid, blood, or pus.  Keep all follow-up visits as told by your health care provider. This is important. SEEK MEDICAL CARE IF:  You received a tetanus and shot and you have swelling, severe pain, redness, or bleeding at the injection site.   You have a fever.  Your pain is not controlled with medicine.  You have increased redness, swelling, or pain at the site of your wound.  You have fluid, blood, or pus coming from your wound.  You notice a bad smell coming from your wound or your dressing.  You notice something coming out of the wound, such as wood or glass.  You notice a change in the color of your skin near your wound.  You develop a new rash.  You need to change the dressing frequently due to fluid, blood, or pus draining from the wound.  You develop numbness around your wound. SEEK IMMEDIATE MEDICAL CARE IF:  Your pain suddenly increases and is severe.  You develop severe swelling around  the wound.  The wound is on your hand or foot and you cannot properly move a finger or toe.  The wound is on your hand or foot and you notice that your fingers or toes look pale or bluish.  You have a red streak going away from your wound.   This information is not intended to replace advice given to you by your health care provider. Make sure you discuss any questions you have with your health care provider.   Document Released: 04/20/2006 Document Revised: 10/07/2014 Document Reviewed: 05/19/2014 Elsevier Interactive Patient Education [employer]

## 2015-11-10 NOTE — ED Provider Notes (Signed)
[facility] Emergency Department Provider Note  ____________________________________________  Time seen: Approximately 9:16 PM  I have reviewed the triage vital signs and the nursing notes.   HISTORY  Chief Complaint Laceration    HPI Jon Stafford is a 35 y.o. male, NAD, who presents to the emergency department with a laceration to his left thumb. He was cutting saran wrap with a kitchen knife in the kitchen at his place of work, [employer], when his hand slipped and he cut the tip of his left thumb with the knife. He admits to pain at the distal left thumb at the site of the laceration. Last tetanus booster was 1 year ago.  Patient states that his manager is aware of his injury and the patient also states that he does not wish to file workers compensation. Patient denies any numbness, weakness, tingling of the left hand or thumb. Has had full range of motion of the digits since the incident occurred.   Past Medical History  Diagnosis Date  . History of blood transfusion   . Broken ribs   . H/O chest tube placement   . Dental caries   . Collapse of left lung     There are no active problems to display for this patient.   Past Surgical History  Procedure Laterality Date  . Splenectomy, total    . Appendectomy      Current Outpatient Rx  Name  Route  Sig  Dispense  Refill  . cephALEXin (KEFLEX) 500 MG capsule   Oral   Take 1 capsule (500 mg total) by mouth 3 (three) times daily.   21 capsule   0   . HYDROcodone-acetaminophen (NORCO) 5-325 MG tablet   Oral   Take 1 tablet by mouth every 6 (six) hours as needed for severe pain.   6 tablet   0   . ibuprofen (ADVIL,MOTRIN) 200 MG tablet   Oral   Take 400 mg by mouth 3 (three) times daily as needed. For pain           . silver sulfADIAZINE (SILVADENE) 1 % cream      Apply to affected area daily   50 g   1     Allergies Tramadol and Morphine and related  No family  history on file.  Social History Social History  Substance Use Topics  . Smoking status: Current Every Day Smoker -- 1.00 packs/day  . Smokeless tobacco: Not on file  . Alcohol Use: No     Review of Systems  Constitutional: No fatigue Eyes: No visual changes.  Cardiovascular: No chest pain. Respiratory: No shortness of breath.  Gastrointestinal: No abdominal pain.  No nausea, vomiting.  Musculoskeletal: Positive for left thumb pain. Negative for left hand or wrist pain.  Skin: Open laceration of left thumb. Negative for rash, redness, swelling. Neurological: Negative for headaches, focal weakness or numbness. No tingling 10-point ROS otherwise negative.  ____________________________________________   PHYSICAL EXAM:  VITAL SIGNS: ED Triage Vitals  Enc Vitals Group     BP 11/10/15 2055 120/86 mmHg     Pulse Rate 11/10/15 2055 86     Resp 11/10/15 2055 18     Temp 11/10/15 2055 98.8 F (37.1 C)     Temp Source 11/10/15 2055 Oral     SpO2 11/10/15 2055 97 %     Weight 11/10/15 2055 145 lb (65.772 kg)     Height 11/10/15 2055  (1.803 m)  Head Cir --      Peak Flow --      Pain Score 11/10/15 2055 8     Pain Loc --      Pain Edu? --      Excl. in GC? --      Constitutional: Alert and oriented. Well appearing and in no acute distress. Eyes: Conjunctivae are normal.  Head: Atraumatic. Cardiovascular: Good peripheral circulation with 2+ pulses noted in the left upper extremity. Capillary refill is brisk in all digits of the left hand. Respiratory: Normal respiratory effort without tachypnea or retractions.  Musculoskeletal: Full ROM of left thumb as well as all digits of the left hand. No lower extremity tenderness nor edema.  No joint effusions. Neurologic:  Normal speech and language. No gross focal neurologic deficits are appreciated.  Skin: 0.5cm Soft tissue deficit of lateral tip of left thumb noted. No finger nail involvement. Minimal bleeding from  laceration. Skin is warm and dry. No rash noted. Psychiatric: Mood and affect are normal. Speech and behavior are normal. Patient exhibits appropriate insight and judgement.   ____________________________________________   LABS  None ____________________________________________  EKG  None ____________________________________________  RADIOLOGY I have personally viewed and evaluated these images (plain radiographs) as part of my medical decision making, as well as reviewing the written report by the radiologist.  Dg Finger Thumb Left  11/10/2015  CLINICAL DATA:  Small avulsion of the tip of the left thumb. EXAM: LEFT THUMB 2+V COMPARISON:  None. FINDINGS: There is no evidence of fracture or dislocation. There is no evidence of arthropathy or other focal bone abnormality. Mild soft tissue swelling of the tip of the first digit. IMPRESSION: No acute fracture or dislocation identified about the left thumb. Electronically Signed   By: Ted Mcalpine M.D.   On: 11/10/2015 21:42    ____________________________________________    PROCEDURES  Procedure(s) performed: LACERATION REPAIR Performed by: Hope Pigeon Authorized by: Hope Pigeon Consent: Verbal consent obtained. Risks and benefits: risks, benefits and alternatives were discussed Consent given by: patient Patient identity confirmed: provided demographic data Prepped and Draped in normal sterile fashion Wound explored  Laceration Location: Distal left thumb  Laceration Length: 0.5cm soft tissue deficit  No Foreign Bodies seen or palpated  Anesthesia: None   Irrigation method: syringe Amount of cleaning: standard  Skin closure: Wound was left open but covered with Surgicel and then bandaged   Patient tolerance: Patient tolerated the procedure well with no immediate complications.    Medications - No data to display   ____________________________________________   INITIAL IMPRESSION / ASSESSMENT AND  PLAN / ED COURSE  Pertinent imaging results that were available during my care of the patient were reviewed by me and considered in my medical decision making (see chart for details).  Patient's diagnosis is consistent with laceration of left thumb without complication. Patient will be discharged home with prescriptions for Keflex and Norco to take as directed. Patient is to keep thumb clean and dry over the next 48 hours to allow healing. Advised that he see follow up with hand specialist in 48 hours for recheck.  Patient is given ED precautions to return to the ED for any worsening or new symptoms.    ____________________________________________  FINAL CLINICAL IMPRESSION(S) / ED DIAGNOSES  Final diagnoses:  Laceration of left thumb without complication, initial encounter      NEW MEDICATIONS STARTED DURING THIS VISIT:  Discharge Medication List as of 11/10/2015 10:19 PM    START taking  these medications   Details  cephALEXin (KEFLEX) 500 MG capsule Take 1 capsule (500 mg total) by mouth 3 (three) times daily., Starting 11/10/2015, Until Discontinued, Print    HYDROcodone-acetaminophen (NORCO) 5-325 MG tablet Take 1 tablet by mouth every 6 (six) hours as needed for severe pain., Starting 11/10/2015, Until Discontinued, Print             Hope PigeonJami L Karanvir Balderston, PA-C 11/11/15 0040  Phineas SemenGraydon Goodman, MD 11/14/15 [phone]

## 2017-03-31 DIAGNOSIS — K029 Dental caries, unspecified: Secondary | ICD-10-CM | POA: Insufficient documentation

## 2017-03-31 DIAGNOSIS — K047 Periapical abscess without sinus: Secondary | ICD-10-CM | POA: Insufficient documentation

## 2017-03-31 DIAGNOSIS — F17200 Nicotine dependence, unspecified, uncomplicated: Secondary | ICD-10-CM | POA: Insufficient documentation

## 2017-03-31 DIAGNOSIS — K0889 Other specified disorders of teeth and supporting structures: Secondary | ICD-10-CM | POA: Diagnosis present

## 2017-03-31 NOTE — ED Triage Notes (Signed)
Patient c/o dental pain, right mouth and along maxilla. Patient has visible swelling to area. Patient not able to fully open mouth for inspection.

## 2017-03-31 NOTE — ED Notes (Signed)
Patient reports taking 3 ibuprofen approx 3 hours ago.

## 2017-04-01 ENCOUNTER — Emergency Department
Admission: EM | Admit: 2017-04-01 | Discharge: 2017-04-01 | Disposition: A | Discharge status: Home / Self Care | Payer: BLUE CROSS/BLUE SHIELD | Attending: Emergency Medicine | Admitting: Emergency Medicine

## 2017-04-01 DIAGNOSIS — K029 Dental caries, unspecified: Secondary | ICD-10-CM

## 2017-04-01 DIAGNOSIS — K047 Periapical abscess without sinus: Secondary | ICD-10-CM

## 2017-04-01 MED ORDER — PENICILLIN V POTASSIUM 250 MG PO TABS
ORAL_TABLET | ORAL | Status: AC
  Filled 2017-04-01: qty 2

## 2017-04-01 MED ORDER — PENICILLIN V POTASSIUM 500 MG PO TABS
500.0000 mg | ORAL_TABLET | Freq: Four times a day (QID) | ORAL | 0 refills | Status: DC
Start: 2017-04-01 — End: 2017-07-21

## 2017-04-01 MED ORDER — OXYCODONE-ACETAMINOPHEN 5-325 MG PO TABS
1.0000 | ORAL_TABLET | Freq: Once | ORAL | Status: AC
  Administered 2017-04-01: 1 via ORAL
  Filled 2017-04-01: qty 1

## 2017-04-01 MED ORDER — PENICILLIN V POTASSIUM 250 MG PO TABS
500.0000 mg | ORAL_TABLET | Freq: Once | ORAL | Status: AC
  Administered 2017-04-01: 500 mg via ORAL

## 2017-04-01 MED ORDER — OXYCODONE-ACETAMINOPHEN 5-325 MG PO TABS: 1.0000 | ORAL_TABLET | Freq: Four times a day (QID) | ORAL | 0 refills | Status: DC | PRN

## 2017-04-01 NOTE — ED Notes (Signed)
Reviewed d/c instructions, follow-up care, prescriptions with patient. Patient verbalized understanding.  

## 2017-04-01 NOTE — ED Provider Notes (Signed)
[facility] Emergency Department Provider Note  ____________________________________________   First MD Initiated Contact with Patient 04/01/17 [phone]     (approximate)  I have reviewed the triage vital signs and the nursing notes.   HISTORY  Chief Complaint Dental Pain   HPI Jon RompJamie R Licklider is a 36 y.o. male with a history of dental caries who is presenting to the emergency department today with right-sided jaw pain and swelling over the past 3 days.  He says that he has had difficulty opening his mouth no difficulty swallowing.  No respiratory difficulties as well.  Does not report any fever.  S[place] and is scheduled to see them in 2 weeks.  However, with this issue came up and he says the pain was too great and he was unable to sleep so came to the emergency department for further evaluation.  Past Medical History:  Diagnosis Date  . Broken ribs   . Collapse of left lung   . Dental caries   . H/O chest tube placement   . History of blood transfusion     There are no active problems to display for this patient.   Past Surgical History:  Procedure Laterality Date  . APPENDECTOMY    . SPLENECTOMY, TOTAL      Prior to Admission medications   Medication Sig Start Date End Date Taking? Authorizing Provider  cephALEXin (KEFLEX) 500 MG capsule Take 1 capsule (500 mg total) by mouth 3 (three) times daily. 11/10/15   Hagler, Jami L, PA-C  HYDROcodone-acetaminophen (NORCO) 5-325 MG tablet Take 1 tablet by mouth every 6 (six) hours as needed for severe pain. 11/10/15   Hagler, Jami L, PA-C  ibuprofen (ADVIL,MOTRIN) 200 MG tablet Take 400 mg by mouth 3 (three) times daily as needed. For pain      [provider]    Allergies Tramadol and Morphine and related  No family history on file.  Social History Social History  Substance Use Topics  . Smoking status: Current Every Day Smoker    Packs/day: 1.00  .  Smokeless tobacco: Never Used  . Alcohol use No    Review of Systems  Constitutional: No fever/chills Eyes: No visual changes. ENT: No sore throat. Cardiovascular: Denies chest pain. Respiratory: Denies shortness of breath. Gastrointestinal: No abdominal pain.  No nausea, no vomiting.  No diarrhea.  No constipation. Genitourinary: Negative for dysuria. Musculoskeletal: Negative for back pain. Skin: Negative for rash. Neurological: Negative for headaches, focal weakness or numbness.   ____________________________________________   PHYSICAL EXAM:  VITAL SIGNS: ED Triage Vitals  Enc Vitals Group     BP 03/31/17 2258 111/68     Pulse Rate 03/31/17 2258 97     Resp 03/31/17 2258 20     Temp 03/31/17 2258 99.9 F (37.7 C)     Temp Source 04/01/17 0135 Oral     SpO2 03/31/17 2258 97 %     Weight 03/31/17 2259 145 lb (65.8 kg)     Height 03/31/17 2259 5\' 11"  (1.803 m)     Head Circumference --      Peak Flow --      Pain Score 03/31/17 2258 9     Pain Loc --      Pain Edu? --      Excl. in GC? --     Constitutional: Alert and oriented. Well appearing and in no acute distress. Eyes: Conjunctivae are normal.  Head: Atraumatic.  Nose: No congestion/rhinnorhea. Mouth/Throat: Mucous membranes are moist.    Grossly the patient has moderate swelling to the right mandible just anterior to the mandibular angle.  On examination of the teeth there are multiple areas of dental erosion especially to the posterior molars on the right mandible.  There appears to be raw mucosal tissue over the right side of the mouth but without any fluctuance along the gumline or externally.  No posterior pharyngeal swelling.  Patient speaking with a normal voice.  Controlling secretions.  No trismus.  Able to swallow.  No pus visualized.  Neck: No stridor.   Cardiovascular: Normal rate, regular rhythm. Grossly normal heart sounds.   Respiratory: Normal respiratory effort.  No retractions. Lungs  CTAB. Gastrointestinal: Soft and nontender. No distention.  Musculoskeletal: No lower extremity tenderness nor edema.  No joint effusions. Neurologic:  Normal speech and language. No gross focal neurologic deficits are appreciated. Skin:  Skin is warm, dry and intact. No rash noted. Psychiatric: Mood and affect are normal. Speech and behavior are normal.  ____________________________________________   LABS (all labs ordered are listed, but only abnormal results are displayed)  Labs Reviewed - No data to display ____________________________________________  EKG   ____________________________________________  RADIOLOGY   ____________________________________________   PROCEDURES  Procedure(s) performed:   Procedures  Critical Care performed:   ____________________________________________   INITIAL IMPRESSION / ASSESSMENT AND PLAN / ED COURSE  Pertinent labs & imaging results that were available during my care of the patient were reviewed by me and considered in my medical decision making (see chart for details).  DDX: Dental caries, dental erosion, dental abscess, facial swelling  [place] controlled substance database reviewed and without any records for this patient.  Patient given Percocet and penicillin in the emergency department.  Patient will be given a prescription for penicillin as well as Percocet for home use.  The patient knows that he must call his dentist Monday morning for an emergent appointment within the next 1-2 days.  Patient to return to the hospital for any worsening or concerning symptoms especially worsening swelling and difficulty breathing or swallowing.  Patient is understanding of the plan and willing to comply.      ____________________________________________   FINAL CLINICAL IMPRESSION(S) / ED DIAGNOSES  Dental abscess.  Dental pain.    NEW MEDICATIONS STARTED DURING THIS VISIT:  New Prescriptions   No medications on  file     Note:  This document was prepared using Dragon voice recognition software and may include unintentional dictation errors.     Myrna Blazer, MD 04/01/17 [phone]

## 2017-07-21 ENCOUNTER — Other Ambulatory Visit: Payer: Self-pay

## 2017-07-21 ENCOUNTER — Emergency Department
Admission: EM | Admit: 2017-07-21 | Discharge: 2017-07-21 | Disposition: A | Discharge status: Home / Self Care | Payer: BLUE CROSS/BLUE SHIELD | Attending: Emergency Medicine | Admitting: Emergency Medicine

## 2017-07-21 ENCOUNTER — Encounter: Payer: Self-pay | Admitting: Emergency Medicine

## 2017-07-21 DIAGNOSIS — Z79899 Other long term (current) drug therapy: Secondary | ICD-10-CM | POA: Insufficient documentation

## 2017-07-21 DIAGNOSIS — K029 Dental caries, unspecified: Secondary | ICD-10-CM | POA: Insufficient documentation

## 2017-07-21 DIAGNOSIS — F17200 Nicotine dependence, unspecified, uncomplicated: Secondary | ICD-10-CM | POA: Insufficient documentation

## 2017-07-21 MED ORDER — PENICILLIN V POTASSIUM 500 MG PO TABS: 500.0000 mg | ORAL_TABLET | Freq: Four times a day (QID) | ORAL | 0 refills | Status: DC

## 2017-07-21 MED ORDER — IBUPROFEN 600 MG PO TABS: 600.0000 mg | ORAL_TABLET | Freq: Three times a day (TID) | ORAL | 0 refills | Status: DC | PRN

## 2017-07-21 NOTE — Discharge Instructions (Signed)
Begin taking penicillin 4 times a day for dental infection.  Ibuprofen 600 mg 3 times daily with food.  [place] for an appointment time.

## 2017-07-21 NOTE — ED Provider Notes (Signed)
[facility] Emergency Department Provider Note  ____________________________________________   First MD Initiated Contact with Patient 07/21/17 1717     (approximate)  I have reviewed the triage vital signs and the nursing notes.   HISTORY  Chief Complaint Dental Pain   HPI Jon Stafford is a 37 y.o. male is here with complaint of dental pain.  Patient states he has an appointment with a dentist on Monday in [place] but is unable to give the name of the dentist.  Patient states he has increased pain in the area and "has to work all weekend".  Currently rates his pain is 7/10.   Past Medical History:  Diagnosis Date  . Broken ribs   . Collapse of left lung   . Dental caries   . H/O chest tube placement   . History of blood transfusion     There are no active problems to display for this patient.   Past Surgical History:  Procedure Laterality Date  . APPENDECTOMY    . SPLENECTOMY, TOTAL      Prior to Admission medications   Medication Sig Start Date End Date Taking? Authorizing Provider  buprenorphine-naloxone (SUBOXONE) 8-2 mg SUBL SL tablet Place 1 tablet under the tongue daily.   Yes [provider]  ibuprofen (ADVIL,MOTRIN) 600 MG tablet Take 1 tablet (600 mg total) by mouth every 8 (eight) hours as needed. 07/21/17   Tommi Rumps, PA-C  penicillin v potassium (VEETID) 500 MG tablet Take 1 tablet (500 mg total) by mouth 4 (four) times daily. 07/21/17   Tommi Rumps, PA-C    Allergies Tramadol and Morphine and related  No family history on file.  Social History Social History   Tobacco Use  . Smoking status: Current Every Day Smoker    Packs/day: 1.00  . Smokeless tobacco: Never Used  Substance Use Topics  . Alcohol use: No  . Drug use: No    Review of Systems Constitutional: No fever/chills Eyes: No visual changes. ENT: Positive dental pain. Cardiovascular: Denies chest pain. Respiratory: Denies  shortness of breath. Neurological: Negative for headaches, focal weakness or numbness. ____________________________________________   PHYSICAL EXAM:  VITAL SIGNS: ED Triage Vitals  Enc Vitals Group     BP 07/21/17 1704 (!) 141/88     Pulse Rate 07/21/17 1704 78     Resp 07/21/17 1704 20     Temp 07/21/17 1704 98.2 F (36.8 C)     Temp Source 07/21/17 1704 Oral     SpO2 07/21/17 1704 100 %     Weight 07/21/17 1707 145 lb (65.8 kg)     Height 07/21/17 1707 5\' 11"  (1.803 m)     Head Circumference --      Peak Flow --      Pain Score 07/21/17 1707 7     Pain Loc --      Pain Edu? --      Excl. in GC? --    Constitutional: Alert and oriented. Well appearing and in no acute distress. Eyes: Conjunctivae are normal.  Head: Atraumatic. Nose: No congestion/rhinnorhea. Mouth/Throat: Mucous membranes are moist.  Oropharynx non-erythematous.  Multiple dental extractions on the mandible.  There are 2 teeth in very poor repair noted at the gumline.  Left lower premolar gum is tender.  No obvious abscess or drainage is noted. Neck: No stridor.   Hematological/Lymphatic/Immunilogical: No cervical lymphadenopathy. Cardiovascular: Normal rate, regular rhythm. Grossly normal heart sounds.  Good peripheral circulation. Respiratory: Normal  respiratory effort.  No retractions. Lungs CTAB. Musculoskeletal: Moves upper and lower extremities without any difficulty.  Normal gait was noted. Neurologic:  Normal speech and language. No gross focal neurologic deficits are appreciated.  Skin:  Skin is warm, dry and intact. No rash noted. Psychiatric: Mood and affect are normal. Speech and behavior are normal.  ____________________________________________   LABS (all labs ordered are listed, but only abnormal results are displayed)  Labs Reviewed - No data to display  PROCEDURES  Procedure(s) performed: None  Procedures  Critical Care performed:  No  ____________________________________________   INITIAL IMPRESSION / ASSESSMENT AND PLAN / ED COURSE  As part of my medical decision making, I reviewed the following data within the electronic MEDICAL RECORD NUMBER Notes from prior ED visits and  Controlled Substance Database.  Patient currently is taking Suboxone.  Patient was given prescription for pen VK 500 mg 4 times daily for 7 days.  He was also given a prescription for ibuprofen 600 mg 3 times daily with food.  He is encouraged to call his dentist next week for an appointment time.  ____________________________________________   FINAL CLINICAL IMPRESSION(S) / ED DIAGNOSES  Final diagnoses:  Pain due to dental caries     ED Discharge Orders        Ordered    penicillin v potassium (VEETID) 500 MG tablet  4 times daily     07/21/17 1756    ibuprofen (ADVIL,MOTRIN) 600 MG tablet  Every 8 hours PRN     07/21/17 1756       Note:  This document was prepared using Dragon voice recognition software and may include unintentional dictation errors.    Tommi RumpsSummers, Marlis Oldaker L, PA-C 07/21/17 1805    Arnaldo NatalMalinda, Paul F, MD 07/21/17 2201

## 2017-07-21 NOTE — ED Triage Notes (Signed)
Presents with dental pain  States he has appt with his dentist on Monday   But having increased pain to left side

## 2017-11-21 ENCOUNTER — Other Ambulatory Visit: Payer: Self-pay

## 2017-11-21 ENCOUNTER — Emergency Department
Admission: EM | Admit: 2017-11-21 | Discharge: 2017-11-21 | Disposition: A | Discharge status: Home / Self Care | Payer: Self-pay | Attending: Emergency Medicine | Admitting: Emergency Medicine

## 2017-11-21 ENCOUNTER — Encounter: Payer: Self-pay | Admitting: Emergency Medicine

## 2017-11-21 DIAGNOSIS — K0889 Other specified disorders of teeth and supporting structures: Secondary | ICD-10-CM | POA: Insufficient documentation

## 2017-11-21 DIAGNOSIS — F17210 Nicotine dependence, cigarettes, uncomplicated: Secondary | ICD-10-CM | POA: Insufficient documentation

## 2017-11-21 MED ORDER — IBUPROFEN 800 MG PO TABS: 800.0000 mg | ORAL_TABLET | Freq: Three times a day (TID) | ORAL | 0 refills | Status: DC | PRN

## 2017-11-21 MED ORDER — AMOXICILLIN 500 MG PO CAPS: 500.0000 mg | ORAL_CAPSULE | Freq: Three times a day (TID) | ORAL | 0 refills | Status: DC

## 2017-11-21 NOTE — ED Notes (Signed)
See triage note  States he thinks that he has a dental abscess   Developed pain after eating pizza couple of days ago.. Pain is mainly on the right lower   Min gum swelling

## 2017-11-21 NOTE — ED Triage Notes (Signed)
C/O toothache x 2 days to right lower jaw.

## 2017-11-21 NOTE — Discharge Instructions (Addendum)
Follow-up with your regular doctor or 1 of the dental clinics as listed below.  Take medication as prescribed.  OPTIONS FOR DENTAL FOLLOW UP CARE  [place] Department of Health and Human Services - Local Safety Net Dental Clinics [email or web address]   [facility] [phone])  [facility] [phone])  [place] [phone])  [facility] [phone])  [facility] [phone]) This clinic caters to the indigent population and is on a lottery system. Location: [employer] [facility], [employer], [address] [place] Clinic Hours: Wednesdays from 6pm - 9pm, patients seen by a lottery system. For dates, call or go to [email or web address] Services: Cleanings, fillings and simple extractions. Payment Options: DENTAL WORK IS FREE OF CHARGE. Bring proof of income or support. Best way to get seen: Arrive at 5:15 pm - this is a lottery, NOT first come/first serve, so arriving earlier will not increase your chances of being seen.     [facility] [phone] Select option 1 for emergencies   Location: [facility], [place], [address], [place] Clinic Hours: No walk-ins accepted - call the day before to schedule an appointment. Check in times are 9:30 am and 1:30 pm. Services: Simple extractions, temporary fillings, pulpectomy/pulp debridement, uncomplicated abscess drainage. Payment Options: PAYMENT IS DUE AT THE TIME OF SERVICE.  Fee is usually $100-200, additional surgical procedures (e.g. abscess drainage) may be extra. Cash, checks, Visa/MasterCard accepted.  Can file Medicaid if patient is covered for dental - patient should call case worker to check. No discount for [facility] patients. Best way to get seen: MUST call the day before and get onto the schedule. Can  usually be seen the next 1-2 days. No walk-ins accepted.     [facility] [phone]   Location: [facility], [address] [place] Clinic Hours: M, W, Th, F 8am or 1:30pm, Tues 9a or 1:30 - first come/first served. Services: Simple extractions, temporary fillings, uncomplicated abscess drainage.  You do not need to be an [facility] resident. Payment Options: PAYMENT IS DUE AT THE TIME OF SERVICE. Dental insurance, otherwise sliding scale - bring proof of income or support. Depending on income and treatment needed, cost is usually $50-200. Best way to get seen: Arrive early as it is first come/first served.     [facility] [phone]   Location: [address] Clinic Hours: Mon-Thu 8a-5p Services: Most basic dental services including extractions and fillings. Payment Options: PAYMENT IS DUE AT THE TIME OF SERVICE. Sliding scale, up to 50% off - bring proof if income or support. Medicaid with dental option accepted. Best way to get seen: Call to schedule an appointment, can usually be seen within 2 weeks OR they will try to see walk-ins - show up at 8a or 2p (you may have to wait).     [facility] [phone] [place] RESIDENTS ONLY   Location: [facility], [address], [place], [place] [address] Clinic Hours: By appointment only. Monday - Thursday 8am-5pm, Friday 8am-12pm Services: Cleanings, fillings, extractions. Payment Options: PAYMENT IS DUE AT THE TIME OF SERVICE. Cash, Visa or MasterCard. Sliding scale - $30 minimum per service. Best way to get seen: Come in to office, complete packet and make an appointment - need proof of income or support monies for each household member and proof of [facility] residence. Usually takes about a  month to get in.     [facility] [phone]   Location: [address] [facility] Hours: Walk-in Urgent Care Dental Services are offered Monday-Friday mornings only. The numbers of emergencies accepted daily is limited to the number of providers available. Maximum 15 - Mondays, Wednesdays & Thursdays Maximum 10 - Tuesdays & Fridays Services: You do not need to be a [facility] resident to be seen for a dental emergency. Emergencies are defined as pain, swelling, abnormal bleeding, or dental trauma. Walkins will receive x-rays if needed. NOTE: Dental cleaning is not an emergency. Payment Options: PAYMENT IS DUE AT THE TIME OF SERVICE. Minimum co-pay is $40.00 for uninsured patients. Minimum co-pay is $3.00 for Medicaid with dental coverage. Dental Insurance is accepted and must be presented at time of visit. Medicare does not cover dental. Forms of payment: Cash, credit card, checks. Best way to get seen: If not previously registered with the clinic, walk-in dental registration begins at 7:15 am and is on a first come/first serve basis. If previously registered with the clinic, call to make an appointment.     [facility] [phone] [place] RESIDENTS ONLY   Location: [address], [place], [place] Clinic Hours: Mon-Thu 10a-2p Services: Extractions only! Payment Options: FREE (donations accepted) - bring proof of income or support Best way to get seen: Call and schedule an appointment OR come at 8am on the 1st Monday of every month (except for holidays) when it is first come/first served.     [facility] [phone]   Location: [address], [place] Clinic Hours: Friday mornings Services, Payment Options, Best way to get seen: Call for info
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
## 2019-01-25 ENCOUNTER — Other Ambulatory Visit: Payer: Self-pay

## 2019-01-25 ENCOUNTER — Emergency Department
Admission: EM | Admit: 2019-01-25 | Discharge: 2019-01-25 | Disposition: A | Discharge status: Home / Self Care | Payer: Worker's Compensation | Attending: Student | Admitting: Student

## 2019-01-25 ENCOUNTER — Encounter: Payer: Self-pay | Admitting: Emergency Medicine

## 2019-01-25 DIAGNOSIS — Z888 Allergy status to other drugs, medicaments and biological substances status: Secondary | ICD-10-CM | POA: Diagnosis not present

## 2019-01-25 DIAGNOSIS — Y9289 Other specified places as the place of occurrence of the external cause: Secondary | ICD-10-CM | POA: Insufficient documentation

## 2019-01-25 DIAGNOSIS — X102XXA Contact with fats and cooking oils, initial encounter: Secondary | ICD-10-CM | POA: Diagnosis not present

## 2019-01-25 DIAGNOSIS — T24031A Burn of unspecified degree of right lower leg, initial encounter: Secondary | ICD-10-CM | POA: Diagnosis present

## 2019-01-25 DIAGNOSIS — Z20828 Contact with and (suspected) exposure to other viral communicable diseases: Secondary | ICD-10-CM | POA: Diagnosis not present

## 2019-01-25 DIAGNOSIS — Y9389 Activity, other specified: Secondary | ICD-10-CM | POA: Diagnosis not present

## 2019-01-25 DIAGNOSIS — Z23 Encounter for immunization: Secondary | ICD-10-CM | POA: Diagnosis not present

## 2019-01-25 DIAGNOSIS — T24232A Burn of second degree of left lower leg, initial encounter: Secondary | ICD-10-CM | POA: Insufficient documentation

## 2019-01-25 DIAGNOSIS — Z885 Allergy status to narcotic agent status: Secondary | ICD-10-CM | POA: Insufficient documentation

## 2019-01-25 DIAGNOSIS — Y990 Civilian activity done for income or pay: Secondary | ICD-10-CM | POA: Insufficient documentation

## 2019-01-25 DIAGNOSIS — T310 Burns involving less than 10% of body surface: Secondary | ICD-10-CM | POA: Diagnosis not present

## 2019-01-25 DIAGNOSIS — T24231A Burn of second degree of right lower leg, initial encounter: Secondary | ICD-10-CM | POA: Diagnosis not present

## 2019-01-25 DIAGNOSIS — F17210 Nicotine dependence, cigarettes, uncomplicated: Secondary | ICD-10-CM | POA: Diagnosis not present

## 2019-01-25 DIAGNOSIS — T300 Burn of unspecified body region, unspecified degree: Secondary | ICD-10-CM

## 2019-01-25 LAB — COMPREHENSIVE METABOLIC PANEL
ALT: 26 U/L (ref 0–44)
AST: 33 U/L (ref 15–41)
Albumin: 4.2 g/dL (ref 3.5–5.0)
Alkaline Phosphatase: 55 U/L (ref 38–126)
Anion gap: 9 (ref 5–15)
BUN: 18 mg/dL (ref 6–20)
CO2: 29 mmol/L (ref 22–32)
Calcium: 9.1 mg/dL (ref 8.9–10.3)
Chloride: 101 mmol/L (ref 98–111)
Creatinine, Ser: 0.92 mg/dL (ref 0.61–1.24)
GFR calc Af Amer: >60 mL/min (ref >60)
GFR calc non Af Amer: >60 mL/min (ref >60)
Glucose, Bld: 114 mg/dL — ABNORMAL HIGH (ref 70–99)
Potassium: 3.6 mmol/L (ref 3.5–5.1)
Sodium: 139 mmol/L (ref 135–145)
Total Bilirubin: 0.9 mg/dL (ref 0.3–1.2)
Total Protein: 7 g/dL (ref 6.5–8.1)

## 2019-01-25 LAB — CBC WITH DIFFERENTIAL/PLATELET
Abs Immature Granulocytes: 0.03 10*3/uL (ref 0.00–0.07)
Basophils Absolute: 0.1 10*3/uL (ref 0.0–0.1)
Basophils Relative: 1 %
Eosinophils Absolute: 0.5 10*3/uL (ref 0.0–0.5)
Eosinophils Relative: 4 %
HCT: 39.7 % (ref 39.0–52.0)
Hemoglobin: 13.5 g/dL (ref 13.0–17.0)
Immature Granulocytes: 0 %
Lymphocytes Relative: 49 %
Lymphs Abs: 6.6 10*3/uL — ABNORMAL HIGH (ref 0.7–4.0)
MCH: 31.9 pg (ref 26.0–34.0)
MCHC: 34 g/dL (ref 30.0–36.0)
MCV: 93.9 fL (ref 80.0–100.0)
Monocytes Absolute: 1.2 10*3/uL — ABNORMAL HIGH (ref 0.1–1.0)
Monocytes Relative: 10 %
Neutro Abs: 4.7 10*3/uL (ref 1.7–7.7)
Neutrophils Relative %: 36 %
Platelets: 248 10*3/uL (ref 150–400)
RBC: 4.23 MIL/uL (ref 4.22–5.81)
RDW: 12.3 % (ref 11.5–15.5)
Smear Review: NORMAL
WBC: 13.1 10*3/uL — ABNORMAL HIGH (ref 4.0–10.5)
nRBC: 0 % (ref 0.0–0.2)

## 2019-01-25 LAB — SARS CORONAVIRUS 2 BY RT PCR (HOSPITAL ORDER, PERFORMED IN ~~LOC~~ HOSPITAL LAB): SARS Coronavirus 2: NEGATIVE

## 2019-01-25 LAB — PROTIME-INR
INR: 1.1 (ref 0.8–1.2)
Prothrombin Time: 13.8 seconds (ref 11.4–15.2)

## 2019-01-25 MED ORDER — FENTANYL CITRATE (PF) 100 MCG/2ML IJ SOLN
75.0000 ug | Freq: Once | INTRAMUSCULAR | Status: AC
  Administered 2019-01-25: 75 ug via INTRAVENOUS

## 2019-01-25 MED ORDER — TETANUS-DIPHTH-ACELL PERTUSSIS 5-2.5-18.5 LF-MCG/0.5 IM SUSP
0.5000 mL | Freq: Once | INTRAMUSCULAR | Status: AC
  Administered 2019-01-25: 0.5 mL via INTRAMUSCULAR
  Filled 2019-01-25: qty 0.5

## 2019-01-25 MED ORDER — SODIUM CHLORIDE 0.9 % IV BOLUS
1000.0000 mL | Freq: Once | INTRAVENOUS | Status: AC
  Administered 2019-01-25: 1000 mL via INTRAVENOUS

## 2019-01-25 MED ORDER — HYDROMORPHONE HCL 1 MG/ML IJ SOLN
0.5000 mg | Freq: Once | INTRAMUSCULAR | Status: AC
  Administered 2019-01-25: 0.5 mg via INTRAVENOUS
  Filled 2019-01-25: qty 1

## 2019-01-25 MED ORDER — FENTANYL CITRATE (PF) 100 MCG/2ML IJ SOLN
INTRAMUSCULAR | Status: DC
Start: 2019-01-25 — End: 2019-01-25
  Filled 2019-01-25: qty 2

## 2019-01-25 MED ORDER — HYDROMORPHONE HCL 1 MG/ML IJ SOLN
1.0000 mg | Freq: Once | INTRAMUSCULAR | Status: AC
  Administered 2019-01-25: 1 mg via INTRAVENOUS
  Filled 2019-01-25: qty 1

## 2019-01-25 NOTE — ED Notes (Signed)
Verbal order given by Dr. Joan Mayans for Dilaudid 1mg 
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
## 2019-01-25 NOTE — Discharge Instructions (Addendum)
Please go DIRECTLY to the [facility], as we discussed. The information is below. They have reserved an appointment for you.  [facility]: [phone]  [address] [place], [place] [address]. [facility]   Please return to the ER for any new or worsening symptoms.
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
## 2019-01-25 NOTE — ED Notes (Signed)
[facility]  CALLED   

## 2019-07-02 ENCOUNTER — Ambulatory Visit
Admission: EM | Admit: 2019-07-02 | Discharge: 2019-07-02 | Disposition: A | Discharge status: Home / Self Care | Payer: Self-pay | Attending: Emergency Medicine | Admitting: Emergency Medicine

## 2019-07-02 ENCOUNTER — Other Ambulatory Visit: Payer: Self-pay

## 2019-07-02 DIAGNOSIS — L03113 Cellulitis of right upper limb: Secondary | ICD-10-CM

## 2019-07-02 MED ORDER — DOXYCYCLINE HYCLATE 100 MG PO CAPS: 100.0000 mg | ORAL_CAPSULE | Freq: Two times a day (BID) | ORAL | 0 refills | Status: AC

## 2019-07-02 MED ORDER — CHLORHEXIDINE GLUCONATE 4 % EX LIQD: Freq: Every day | CUTANEOUS | 0 refills | Status: AC | PRN

## 2019-07-02 MED ORDER — IBUPROFEN 600 MG PO TABS: 600.0000 mg | ORAL_TABLET | Freq: Four times a day (QID) | ORAL | 0 refills | Status: AC | PRN

## 2019-07-02 MED ORDER — CEFTRIAXONE SODIUM 1 G IJ SOLR
1.0000 g | Freq: Once | INTRAMUSCULAR | Status: AC
  Administered 2019-07-02: 1 g via INTRAMUSCULAR

## 2019-07-02 NOTE — ED Provider Notes (Signed)
HPI  SUBJECTIVE:  Jon Stafford is a right-handed 39 y.o. male who presents with a "bump" on the dorsum of his right hand.  Describes it as a pimple or whitehead with some erythema around it. he tried popping it last night, and was followed by rapidly increasing erythema swelling and throbbing, sore, constant pain.  States that he cannot make a fist secondary to the edema.  States that the erythema and swelling has gotten worse since this morning, it is migrating proximally.  He denies any known trauma, insect bite to the hand.  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  No contacts with MRSA.  No fevers, body aches, nausea, vomiting.  He tried lancing it and applying Neosporin without improvement in his symptoms.  Symptoms are worse with flexing and opening his hand.  Past medical history negative for diabetes, hypertension, MRSA.  He is a smoker.  PMD: None.   Past Medical History:  Diagnosis Date  . Broken ribs   . Collapse of left lung   . Dental caries   . H/O chest tube placement   . History of blood transfusion     Past Surgical History:  Procedure Laterality Date  . APPENDECTOMY    . SPLENECTOMY, TOTAL      Family History  Problem Relation Age of Onset  . Diabetes Father     Social History   Tobacco Use  . Smoking status: Current Every Day Smoker    Packs/day: 1.00  . Smokeless tobacco: Never Used  Substance Use Topics  . Alcohol use: No  . Drug use: No     Current Facility-Administered Medications:  .  cefTRIAXone (ROCEPHIN) injection 1 g, 1 g, Intramuscular, Once, Melynda Ripple, MD  Current Outpatient Medications:  .  citalopram (CELEXA) 40 MG tablet, Take 40 mg by mouth daily., Disp: , Rfl:  .  chlorhexidine (HIBICLENS) 4 % external liquid, Apply topically daily as needed. Dilute 10-15 mL in water, Use daily when bathing for 1-2 weeks, Disp: 120 mL, Rfl: 0 .  doxycycline (VIBRAMYCIN) 100 MG capsule, Take 1 capsule (100 mg total) by mouth 2  (two) times daily for 7 days., Disp: 14 capsule, Rfl: 0 .  ibuprofen (ADVIL) 600 MG tablet, Take 1 tablet (600 mg total) by mouth every 6 (six) hours as needed., Disp: 30 tablet, Rfl: 0  Allergies  Allergen Reactions  . Tramadol Nausea And Vomiting  . Morphine And Related Rash    Pt arm turned bright red along the vein in his hand but did not itch.     ROS  As noted in HPI.   Physical Exam  BP 93/63 (BP Location: Left Arm)   Pulse 84   Temp 97.9 F (36.6 C) (Oral)   Resp 18   Ht 5\' 11"  (1.803 m)   Wt 65.8 kg   SpO2 98%   BMI 20.22 kg/m   Constitutional: Well developed, well nourished, no acute distress Eyes:  EOMI, conjunctiva normal bilaterally HENT: Normocephalic, atraumatic,mucus membranes moist Respiratory: Normal inspiratory effort Cardiovascular: Normal rate GI: nondistended skin: No rash, skin intact Musculoskeletal: Diffuse erythematous tender edema dorsum right hand with a pustule at the fourth MCP.  Marked area of erythema with a marker.  Measures 13 x 14 cm.  Limited range of motion of the fingers due to the edema.  Sensation distally grossly intact.      Neurologic: Alert & oriented x 3, no focal neuro deficits Psychiatric: Speech and behavior  appropriate   ED Course   Medications  cefTRIAXone (ROCEPHIN) injection 1 g (has no administration in time range)    No orders of the defined types were placed in this encounter.   No results found for this or any previous visit (from the past 24 hour(s)). No results found.  ED Clinical Impression  1. Cellulitis of right hand      ED Assessment/Plan  Patient with a cellulitis of the right hand, suspect MRSA.  Wound culture sent.  Given the rapidity at which this is spread/gotten worse, will give 1 g of Rocephin here today.  Sending home with 1 week of doxycycline.  Ibuprofen 600 mg combined with 1 g of Tylenol 3-4 times a day as needed for pain.  Hibiclens.  May return here in 3 to 3 days if not  getting better to readjust antibiotics.  He is to go to the ER if he gets worse.  Providing primary care list for ongoing care.  Discussed MDM, treatment plan, and plan for follow-up with patient. Discussed sn/sx that should prompt return to the ED. patient agrees with plan.   Meds ordered this encounter  Medications  . cefTRIAXone (ROCEPHIN) injection 1 g  . doxycycline (VIBRAMYCIN) 100 MG capsule    Sig: Take 1 capsule (100 mg total) by mouth 2 (two) times daily for 7 days.    Dispense:  14 capsule    Refill:  0  . ibuprofen (ADVIL) 600 MG tablet    Sig: Take 1 tablet (600 mg total) by mouth every 6 (six) hours as needed.    Dispense:  30 tablet    Refill:  0  . chlorhexidine (HIBICLENS) 4 % external liquid    Sig: Apply topically daily as needed. Dilute 10-15 mL in water, Use daily when bathing for 1-2 weeks    Dispense:  120 mL    Refill:  0    *This clinic note was created using Scientist, clinical (histocompatibility and immunogenetics). Therefore, there may be occasional mistakes despite careful proofreading.   ?    Domenick Gong, MD 07/02/19 1440

## 2019-07-02 NOTE — ED Triage Notes (Signed)
Patient complains of possible spider bite to right hand. Patient has noticed redness and swelling and pain x 2 days. Patient states that he tried to pop the area yesterday without much success.

## 2019-07-02 NOTE — Discharge Instructions (Addendum)
Keep this clean with Hibiclens.  Apply bacitracin to the lesion.  Finish the doxycycline, even if you feel better.  Take 600 mg of ibuprofen combined with 1000 mg of Tylenol 3-4 times a day as needed for pain.  Here is a list of primary care providers who are taking new patients:  Dr. Elizabeth Sauer, Dr. Schuyler Amor [address] [place] [address] [phone]  [facility] [address] [place] [address]  [phone]  [facility] [address] [place], [place] [address] [phone]  [facility] [address] [place]  [phone] [place], [place] [address]  Here are clinics/ other resources who will see you if you do not have insurance. Some have certain criteria that you must meet. Call them and find out what they are:  [facility]: [address], [place], [place] [address] Phone: [phone] Hours: First and Third Saturdays of each Month, 9 a.m. - 1 p.m.  [facility]: [address] [place], [place] [address] Phone: [phone] Hours: Tuesday, 4 p.m. - 8 p.m. Thursday, 1 p.m. - 8 p.m. Wednesday, 9 a.m. - [facility] [address] [place], [place] [address] Phone: [phone] Pharmacy Phone Number: [phone] Dental Phone Number: [phone] [facility] Insurance Help: [phone]  Dental Hours: Monday - Thursday, 8 a.m. - 6 p.m.  [facility] [address] [place], [place] [address] Phone: [phone] Pharmacy Phone Number: [phone] [facility] Insurance Help: [phone]  [facility] [address] [place] [place], [place] [address] Phone: [phone] Pharmacy Phone Number: [phone] [facility] Insurance Help: [phone]  [facility] [address] [place], [place] [address] Phone: [phone] [facility] Insurance Help: [phone]   [facility] [address], [place], [place]  [address] Phone: [phone]  Go to www.goodrx.com to look up your medications. This will give you a list of where you can find your prescriptions at the most affordable prices. Or ask the pharmacist what the cash price is, or if they have any other discount programs available to help make your medication more affordable. This can be less expensive than what you would pay with insurance.

## 2019-07-05 ENCOUNTER — Emergency Department: Payer: Medicaid Other

## 2019-07-05 ENCOUNTER — Emergency Department
Admission: EM | Admit: 2019-07-05 | Discharge: 2019-07-05 | Disposition: A | Discharge status: Other Acute Inpatient Hospital | Payer: Medicaid Other | Attending: Student | Admitting: Student

## 2019-07-05 ENCOUNTER — Other Ambulatory Visit: Payer: Self-pay

## 2019-07-05 ENCOUNTER — Telehealth (HOSPITAL_COMMUNITY): Payer: Self-pay | Admitting: Emergency Medicine

## 2019-07-05 DIAGNOSIS — B999 Unspecified infectious disease: Secondary | ICD-10-CM

## 2019-07-05 DIAGNOSIS — L03113 Cellulitis of right upper limb: Secondary | ICD-10-CM | POA: Insufficient documentation

## 2019-07-05 DIAGNOSIS — F17200 Nicotine dependence, unspecified, uncomplicated: Secondary | ICD-10-CM | POA: Insufficient documentation

## 2019-07-05 DIAGNOSIS — L02519 Cutaneous abscess of unspecified hand: Secondary | ICD-10-CM

## 2019-07-05 DIAGNOSIS — L02511 Cutaneous abscess of right hand: Secondary | ICD-10-CM | POA: Insufficient documentation

## 2019-07-05 LAB — AEROBIC CULTURE W GRAM STAIN (SUPERFICIAL SPECIMEN)

## 2019-07-05 LAB — CBC WITH DIFFERENTIAL/PLATELET
Abs Immature Granulocytes: 0.08 10*3/uL — ABNORMAL HIGH (ref 0.00–0.07)
Basophils Absolute: 0.1 10*3/uL (ref 0.0–0.1)
Basophils Relative: 0 %
Eosinophils Absolute: 0.5 10*3/uL (ref 0.0–0.5)
Eosinophils Relative: 3 %
HCT: 38.2 % — ABNORMAL LOW (ref 39.0–52.0)
Hemoglobin: 12.8 g/dL — ABNORMAL LOW (ref 13.0–17.0)
Immature Granulocytes: 1 %
Lymphocytes Relative: 21 %
Lymphs Abs: 3.2 10*3/uL (ref 0.7–4.0)
MCH: 30.8 pg (ref 26.0–34.0)
MCHC: 33.5 g/dL (ref 30.0–36.0)
MCV: 91.8 fL (ref 80.0–100.0)
Monocytes Absolute: 1.7 10*3/uL — ABNORMAL HIGH (ref 0.1–1.0)
Monocytes Relative: 11 %
Neutro Abs: 10 10*3/uL — ABNORMAL HIGH (ref 1.7–7.7)
Neutrophils Relative %: 64 %
Platelets: 228 10*3/uL (ref 150–400)
RBC: 4.16 MIL/uL — ABNORMAL LOW (ref 4.22–5.81)
RDW: 13.3 % (ref 11.5–15.5)
WBC: 15.6 10*3/uL — ABNORMAL HIGH (ref 4.0–10.5)
nRBC: 0 % (ref 0.0–0.2)

## 2019-07-05 LAB — AEROBIC CULTURE? (SUPERFICIAL SPECIMEN)

## 2019-07-05 LAB — BASIC METABOLIC PANEL
Anion gap: 7 (ref 5–15)
BUN: 23 mg/dL — ABNORMAL HIGH (ref 6–20)
CO2: 31 mmol/L (ref 22–32)
Calcium: 8.9 mg/dL (ref 8.9–10.3)
Chloride: 101 mmol/L (ref 98–111)
Creatinine, Ser: 1.03 mg/dL (ref 0.61–1.24)
GFR calc Af Amer: >60 mL/min (ref >60)
GFR calc non Af Amer: >60 mL/min (ref >60)
Glucose, Bld: 85 mg/dL (ref 70–99)
Potassium: 3.8 mmol/L (ref 3.5–5.1)
Sodium: 139 mmol/L (ref 135–145)

## 2019-07-05 LAB — LACTIC ACID, PLASMA
Lactic Acid, Venous: 1.1 mmol/L (ref 0.5–1.9)
Lactic Acid, Venous: 1.5 mmol/L (ref 0.5–1.9)

## 2019-07-05 LAB — SEDIMENTATION RATE: Sed Rate: 17 mm/hr — ABNORMAL HIGH (ref 0–15)

## 2019-07-05 MED ORDER — VANCOMYCIN HCL 1500 MG/300ML IV SOLN
1500.0000 mg | Freq: Once | INTRAVENOUS | Status: AC
  Administered 2019-07-05: 1500 mg via INTRAVENOUS
  Filled 2019-07-05: qty 300

## 2019-07-05 MED ORDER — SODIUM CHLORIDE 0.9 % IV SOLN
1.0000 g | Freq: Once | INTRAVENOUS | Status: AC
  Administered 2019-07-05: 1 g via INTRAVENOUS
  Filled 2019-07-05: qty 10

## 2019-07-05 MED ORDER — HYDROMORPHONE HCL 1 MG/ML IJ SOLN
0.5000 mg | Freq: Once | INTRAMUSCULAR | Status: AC
  Administered 2019-07-05: 21:00:00 0.5 mg via INTRAVENOUS
  Filled 2019-07-05: qty 1

## 2019-07-05 NOTE — ED Triage Notes (Signed)
Pt to the er for an abcess on the posterior right hand at the 4th digit knuckle. Pt has been taking doxycycline since Tuesday. Pt reports area is worse.

## 2019-07-05 NOTE — ED Notes (Signed)
Urgent care notified pt by phone that culture shows MRSA.

## 2019-07-05 NOTE — ED Notes (Addendum)
Pt's rt hand is swollen, red, hot to touch. Pt states s/s began 5 days ago and he was seen for it 3 days ago at clinic and was told it was MRSA- pt was given oral antibiotics w/out improvement.

## 2019-07-05 NOTE — ED Notes (Signed)
Report given to Erlene Quan at [facility] ED and Ladene Artist at [facility] who stated that they will not be available for hours and [facility]  also is not available. Secretary informed.

## 2019-07-05 NOTE — ED Notes (Signed)
[facility] called for transfer to [facility] ED

## 2019-07-05 NOTE — ED Provider Notes (Signed)
[facility] Emergency Department Provider Note  ____________________________________________   First MD Initiated Contact with Patient 07/05/19 1752     (approximate)  I have reviewed the triage vital signs and the nursing notes.  History  Chief Complaint Abscess    HPI Jon Stafford is a 39 y.o. male who presents to the emergency department for right hand cellulitis and abscess.  Patient first noticed a lesion on the dorsum of his right hand over the fourth MCP area on Monday.  He initially thought it was a reaction to a spider bite.  He tried popping it.  After this he developed rapid increasing erythema, swelling, warmth.  He was seen at urgent care on 1/26, started on doxycycline which he reports compliance with..  He states the doxycycline briefly improved his symptoms, however over the last 24 hours the swelling and erythema has again worsened. Over the last 24 hours he has now developed redness to the palmar aspect of his hand over the 4th MCP area.  No fevers, but associated with nausea and several episodes of emesis (which he thinks may be related to the antibiotic).  He denies any weakness, numbness, tingling of the hand.  Does have some limited range of motion of the fingers that he attributes to the edema.  He is right-hand dominant.   Past Medical Hx Past Medical History:  Diagnosis Date  . Broken ribs   . Collapse of left lung   . Dental caries   . H/O chest tube placement   . History of blood transfusion     Problem List There are no problems to display for this patient.   Past Surgical Hx Past Surgical History:  Procedure Laterality Date  . APPENDECTOMY    . SPLENECTOMY, TOTAL      Medications Prior to Admission medications   Medication Sig Start Date End Date Taking? Authorizing Provider  chlorhexidine (HIBICLENS) 4 % external liquid Apply topically daily as needed. Dilute 10-15 mL in water, Use daily when bathing for 1-2  weeks 07/02/19   Melynda Ripple, MD  citalopram (CELEXA) 40 MG tablet Take 40 mg by mouth daily.    [provider]  doxycycline (VIBRAMYCIN) 100 MG capsule Take 1 capsule (100 mg total) by mouth 2 (two) times daily for 7 days. 07/02/19 07/09/19  Melynda Ripple, MD  ibuprofen (ADVIL) 600 MG tablet Take 1 tablet (600 mg total) by mouth every 6 (six) hours as needed. 07/02/19   Melynda Ripple, MD    Allergies Tramadol and Morphine and related  Family Hx Family History  Problem Relation Age of Onset  . Diabetes Father     Social Hx Social History   Tobacco Use  . Smoking status: Current Every Day Smoker    Packs/day: 1.00  . Smokeless tobacco: Never Used  Substance Use Topics  . Alcohol use: No  . Drug use: No     Review of Systems  Constitutional: Negative for fever, chills. Eyes: Negative for visual changes. ENT: Negative for sore throat. Cardiovascular: Negative for chest pain. Respiratory: Negative for shortness of breath. Gastrointestinal: Negative for nausea, vomiting.  Genitourinary: Negative for dysuria. Musculoskeletal: Negative for leg swelling. Skin: + R hand cellulitis and abscess Neurological: Negative for headaches.   Physical Exam  Vital Signs: ED Triage Vitals  Enc Vitals Group     BP 07/05/19 1645 106/89     Pulse Rate 07/05/19 1645 87     Resp 07/05/19 1645 18  Temp 07/05/19 1645 98.2 F (36.8 C)     Temp Source 07/05/19 1645 Oral     SpO2 07/05/19 1645 100 %     Weight 07/05/19 1646 140 lb (63.5 kg)     Height 07/05/19 1646 5\' 11"  (1.803 m)     Head Circumference --      Peak Flow --      Pain Score 07/05/19 1646 7     Pain Loc --      Pain Edu? --      Excl. in GC? --     Constitutional: Alert and oriented.  Head: Normocephalic. Atraumatic. Eyes: Conjunctivae clear. Sclera anicteric. Nose: No congestion. No rhinorrhea. Mouth/Throat: Wearing mask.  Neck: No stridor.   Cardiovascular: Normal rate, regular rhythm.  Extremities well perfused. Respiratory: Normal respiratory effort.  Musculoskeletal: R hand: ~2 x 2 cm area of erythema and fluctuance, concerning for abscess, overlying the 4th MCP on dorsum of hand. Spreading erythema from this point distally, towards the PIP of the 3rd, 4th, 5th digit, and extending proximally streaking up the ulnar aspect of the hand. Also noted to have erythema and induration overlying the 4th MCP on the palmar aspect of the hand as well. 2+ radial pulse. No crepitance. Cap refill < 2 seconds. Motor/sensation in tact in radial, ulnar, median distribution. ROM at MCP of 4th digit somewhat limited 2/2 edema. No fusiform swelling of the finger or tenderness along the tendon sheath to suggest flexor tenosynovitis.  Neurologic:  Normal speech and language. No gross focal neurologic deficits are appreciated. RIGHT motor/sensation in tact in radial, ulnar, median distribution. Grip strength in tact. Skin: See MSK above.  Psychiatric: Mood and affect are appropriate for situation.     Radiology  XR: IMPRESSION: Soft tissue swelling. No underlying bony abnormality is identified.     Procedures  Procedure(s) performed (including critical care):  Procedures   Initial Impression / Assessment and Plan / ED Course  39 y.o. male who presents to the ED for right hand cellulitis and abscess, exam as above.  In comparison with the image from his 1/26 urgent care visit, it does appear that his infection is worsening. Concern for deeper space infection based on exam given new developed erythema on the palmar aspect.  Will obtain labs, imaging, initiate IV antibiotics.  Will discuss with orthopedics, suspect patient will require drainage, possible by Hand.  Discussed w/ Orthopedics here who recommend evaluation by Hand specialist. Discussed w/ patient, as this will require transfer, who requests [facility].   Discussed w/ orthopedics Dr. 2/26 at [facility], who recommends ED to ED transfer  for further evaluation.   Discussed w/ [facility], ED MD who accepts for transfer. Updated patient on plan of care.    Final Clinical Impression(s) / ED Diagnosis  Final diagnoses:  Cellulitis and abscess of hand       Note:  This document was prepared using Dragon voice recognition software and may include unintentional dictation errors.   Debarah Crape., MD 07/05/19 2016

## 2019-07-05 NOTE — ED Triage Notes (Signed)
FIRST NURSE NOTE- here for infection to right hand. Has been on abx from urgent care but getting worse.  Ambulatory.

## 2019-07-05 NOTE — Consult Note (Signed)
PHARMACY -  BRIEF ANTIBIOTIC NOTE   Pharmacy has received consult(s) for vancomycin from an ED provider. Patient also ordered ceftriaxone x 1. On doxycycline out-patient. The patient's profile has been reviewed for ht/wt/allergies/indication/available labs. No antibiotic drug allergies. Weight 63.5 kg.  One time order(s) placed for  -Vancomycin 1500 mg   Further antibiotics/pharmacy consults should be ordered by admitting physician if indicated.                       Thank you, Tressie Ellis  Pharmacy Resident 07/05/2019  6:10 PM

## 2019-07-05 NOTE — Telephone Encounter (Signed)
Wound culture treated with doxy. Pt notified by phone, all questions answered.

## 2019-07-06 LAB — C-REACTIVE PROTEIN: CRP: 1.9 mg/dL — ABNORMAL HIGH (ref <1.0)

## 2019-07-06 MED ORDER — OXYCODONE HCL 5 MG PO TABS
5.00 | ORAL_TABLET | ORAL | Status: DC
Start: ? — End: 2019-07-06

## 2019-07-06 MED ORDER — CEFEPIME HCL 2 G IJ SOLR
2.00 | INTRAMUSCULAR | Status: DC
Start: 2019-07-07 — End: 2019-07-06

## 2019-07-06 MED ORDER — ACETAMINOPHEN 325 MG PO TABS
650.00 | ORAL_TABLET | ORAL | Status: DC
Start: ? — End: 2019-07-06

## 2019-07-06 MED ORDER — IBUPROFEN 400 MG PO TABS
400.00 | ORAL_TABLET | ORAL | Status: DC
Start: ? — End: 2019-07-06

## 2019-07-06 MED ORDER — GENERIC EXTERNAL MEDICATION
Status: DC
Start: ? — End: 2019-07-06

## 2019-07-06 MED ORDER — ONDANSETRON 4 MG PO TBDP
4.00 | ORAL_TABLET | ORAL | Status: DC
Start: ? — End: 2019-07-06

## 2019-07-06 NOTE — Consult Note (Signed)
Discussed patient with Dr. Colon Branch overnight.  Patient is a 39 y/o RHD male presenting with right hand cellulitis/abscess.  Erythema noted first on Monday.   He was seen at an UC after trying to decompress abscess at home resulting in increased erythema and swelling.   UC started patient on doxycycline.  Dr. Colon Branch reports patient has expanding erythema dorsally as well as erythema developing on the palmar aspect of the hand.  Patient is reported to be NVI and without fever.  Concern is for a developing deep space infection.   I have recommended the patient be transferred to a center with hand surgeon for possible surgical management.

## 2019-07-08 MED ORDER — INFLUENZA VAC SPLIT QUAD 0.5 ML IM SUSY
0.50 | PREFILLED_SYRINGE | INTRAMUSCULAR | Status: DC
Start: ? — End: 2019-07-08

## 2019-07-08 MED ORDER — GENERIC EXTERNAL MEDICATION
Status: DC
Start: ? — End: 2019-07-08

## 2019-07-10 LAB — CULTURE, BLOOD (ROUTINE X 2)
Culture: NO GROWTH
Culture: NO GROWTH

## 2021-10-22 IMAGING — DX DG HAND COMPLETE 3+V*R*
3 series · 3 of 3 positions shown · non-contrast
Comparison: None.

CLINICAL DATA: Swelling, warmness, redness, and infection to right
hand. Bug bite on third MCP.

EXAM:
RIGHT HAND - COMPLETE 3+ VIEW

[hand ap]
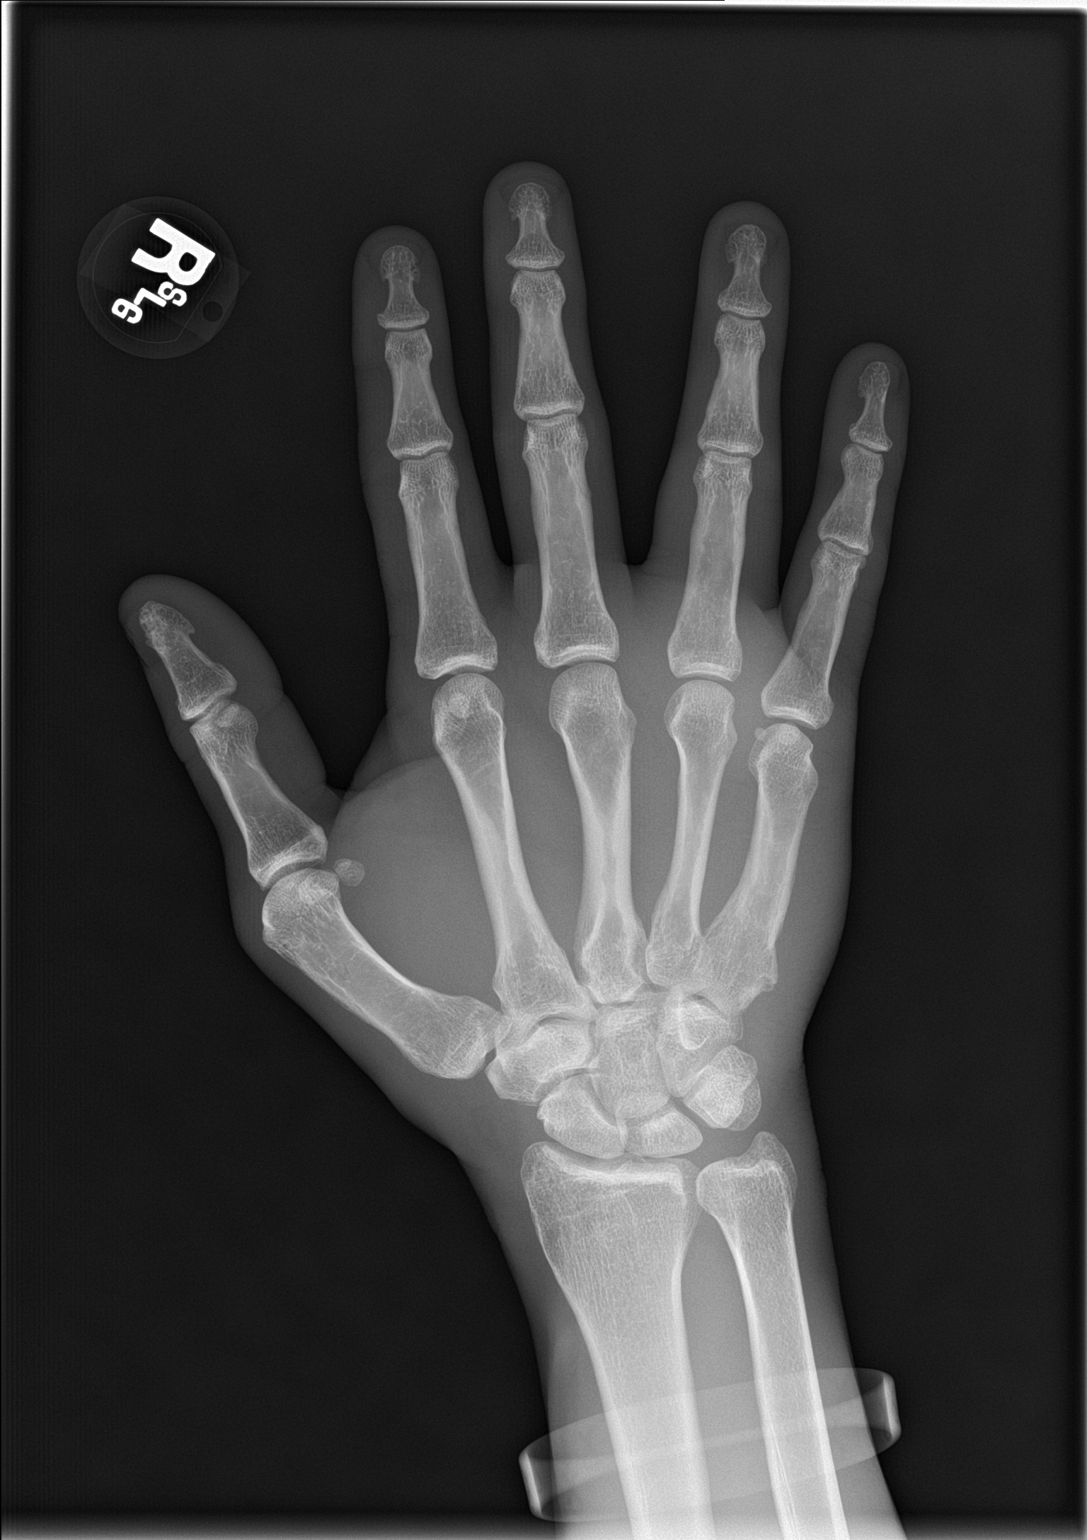

[hand obl]
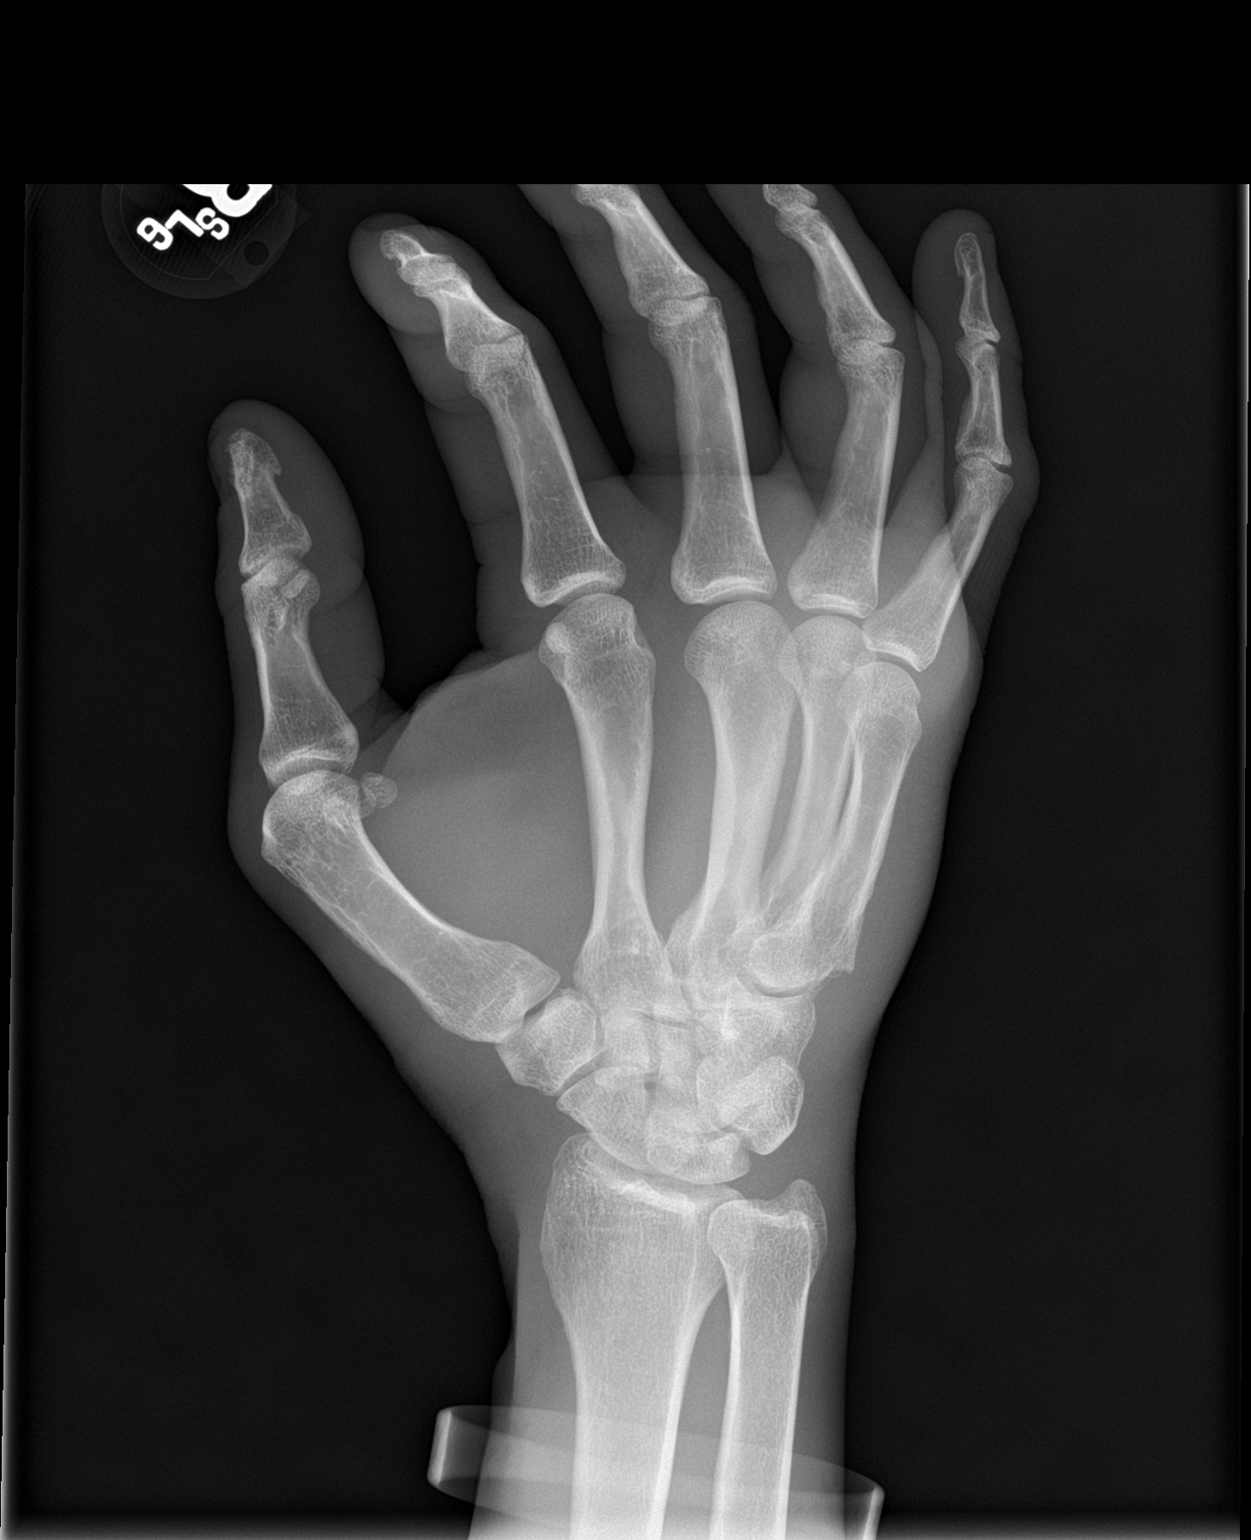

[hand lat]
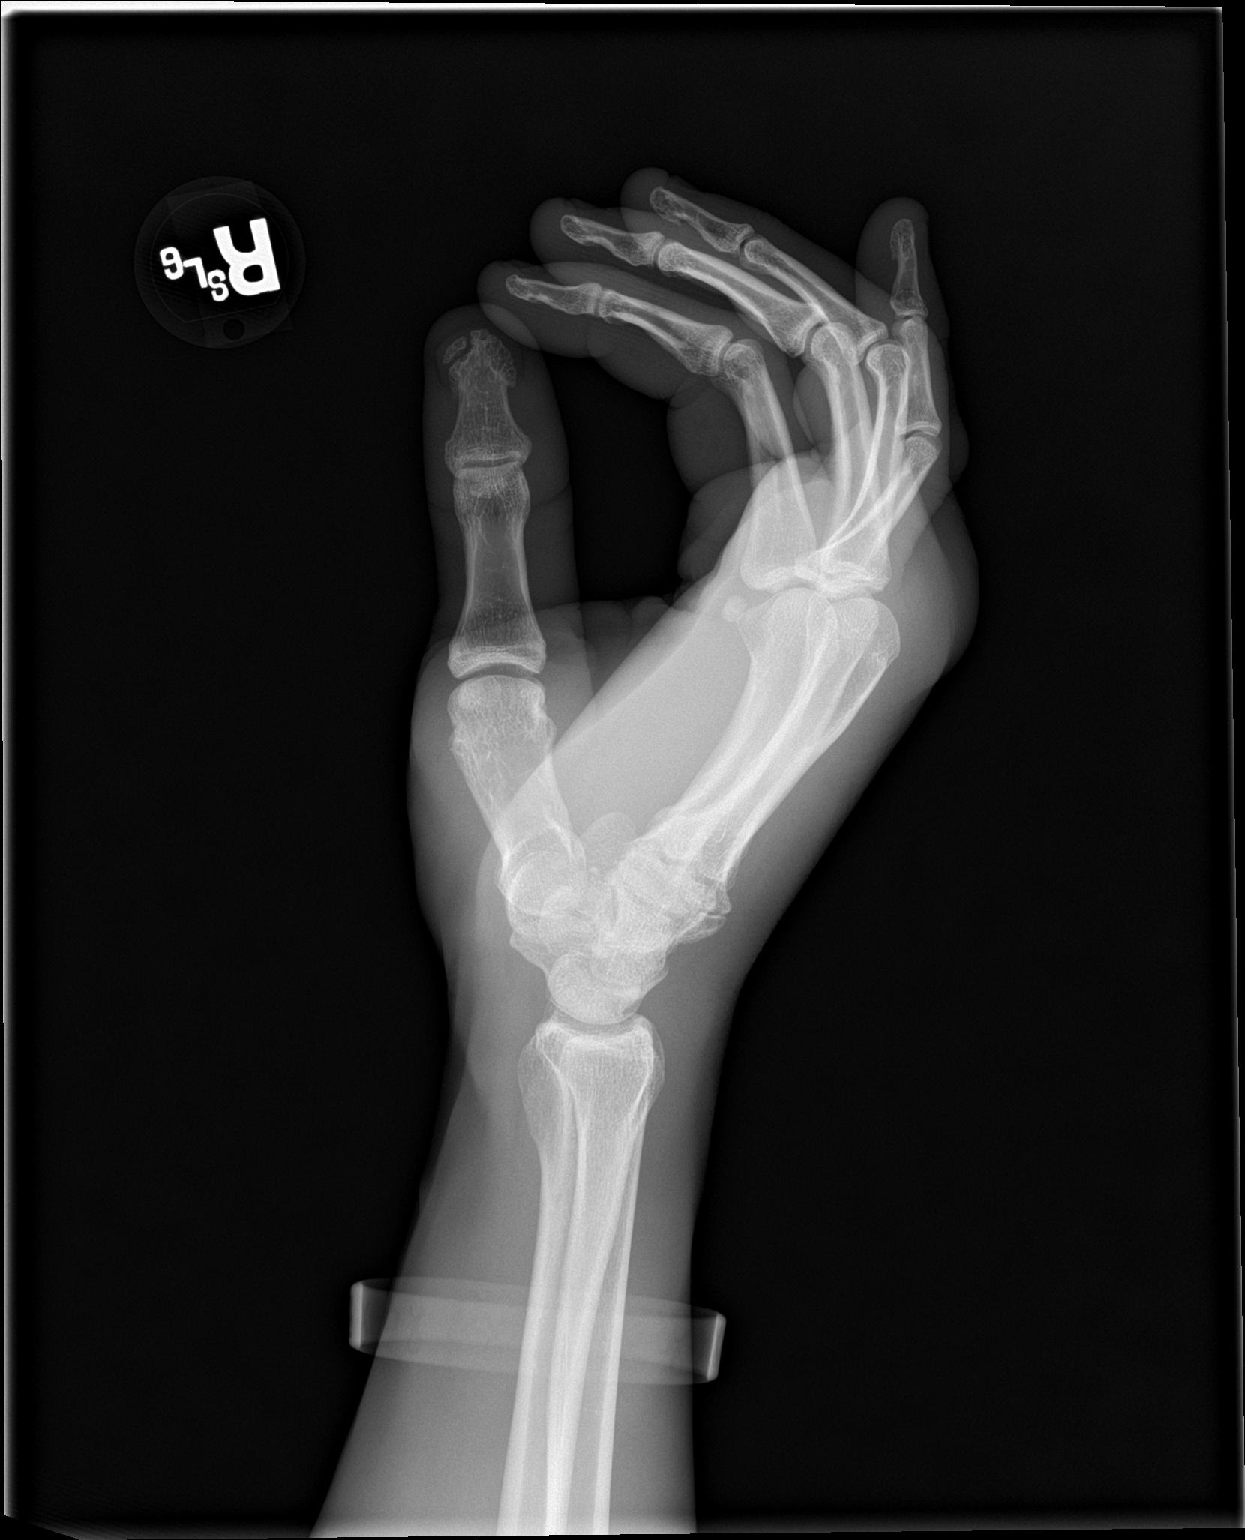

[3 of 3 positions shown; findings below may reference images not displayed]

FINDINGS: Soft tissue swelling.  No fracture.  No bony erosion.
IMPRESSION: Soft tissue swelling.  No underlying bony abnormality is identified.

## 2022-11-09 DIAGNOSIS — F1410 Cocaine abuse, uncomplicated: Secondary | ICD-10-CM | POA: Diagnosis not present

## 2022-11-09 DIAGNOSIS — F1121 Opioid dependence, in remission: Secondary | ICD-10-CM | POA: Diagnosis not present

## 2022-11-09 DIAGNOSIS — G4700 Insomnia, unspecified: Secondary | ICD-10-CM | POA: Diagnosis not present

## 2022-12-09 ENCOUNTER — Emergency Department
Admission: EM | Admit: 2022-12-09 | Discharge: 2022-12-09 | Disposition: A | Discharge status: Home / Self Care | Payer: 59 | Attending: Emergency Medicine | Admitting: Emergency Medicine

## 2022-12-09 ENCOUNTER — Emergency Department: Payer: 59

## 2022-12-09 ENCOUNTER — Encounter: Payer: Self-pay | Admitting: Emergency Medicine

## 2022-12-09 ENCOUNTER — Other Ambulatory Visit: Payer: Self-pay

## 2022-12-09 DIAGNOSIS — W260XXA Contact with knife, initial encounter: Secondary | ICD-10-CM | POA: Insufficient documentation

## 2022-12-09 DIAGNOSIS — S61012A Laceration without foreign body of left thumb without damage to nail, initial encounter: Secondary | ICD-10-CM

## 2022-12-09 DIAGNOSIS — S60932A Unspecified superficial injury of left thumb, initial encounter: Secondary | ICD-10-CM | POA: Diagnosis not present

## 2022-12-09 NOTE — Discharge Instructions (Signed)
The dressing that is placed on your hand today should be left on for 2 days and then after that removed and clean daily with mild soap and water and allowed to dry completely before covering it.  Watch the area for any signs of infection.  You may take Tylenol or ibuprofen if needed for pain.
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
## 2022-12-09 NOTE — ED Triage Notes (Signed)
Pt presents ambulatory to triage via POV with complaints of a laceration to the L thumb that occurred this AM at work. Pt states he was cutting ham and cut the tip of his thumb with a knife. Bleeding controlled with gauze and curlex - not on thinners. Rates the pain 9/10. Unsure if UTD on tetanus. A&Ox4 at this time. Denies CP or SOB.

## 2023-01-03 DIAGNOSIS — G4700 Insomnia, unspecified: Secondary | ICD-10-CM | POA: Diagnosis not present

## 2023-01-03 DIAGNOSIS — F17200 Nicotine dependence, unspecified, uncomplicated: Secondary | ICD-10-CM | POA: Diagnosis not present

## 2023-01-03 DIAGNOSIS — F1410 Cocaine abuse, uncomplicated: Secondary | ICD-10-CM | POA: Diagnosis not present

## 2023-01-03 DIAGNOSIS — F1121 Opioid dependence, in remission: Secondary | ICD-10-CM | POA: Diagnosis not present

## 2024-02-20 ENCOUNTER — Ambulatory Visit: Admitting: Internal Medicine

## 2024-02-20 NOTE — Progress Notes (Deleted)
 Name: Jon Stafford MRN: [id number] DOB: [birth year]    CHIEF COMPLAINT:  ASSESSMENT OF SLEEP APNEA EXCESSIVE DAYTIME SLEEPINESS   HISTORY OF PRESENT ILLNESS: Patient is seen today for problems and issues with sleep related to excessive daytime sleepiness Patient  has been having sleep problems for many years Patient has been having excessive daytime sleepiness for a long time Patient has been having extreme fatigue and tiredness, lack of energy +  very Loud snoring every night + struggling breathe at night and gasps for air + morning headaches + Nonrefreshing sleep  Discussed sleep data and reviewed with patient.  Encouraged proper weight management.  Discussed driving precautions and its relationship with hypersomnolence.  Discussed operating dangerous equipment and its relationship with hypersomnolence.  Discussed sleep hygiene, and benefits of a fixed sleep waked time.  The importance of getting eight or more hours of sleep discussed with patient.  Discussed limiting the use of the computer and television before bedtime.  Decrease naps during the day, so night time sleep will become enhanced.  Limit caffeine, and sleep deprivation.  HTN, stroke, and heart failure are potential risk factors.    EPWORTH SLEEP SCORE***    PAST MEDICAL HISTORY :   has a past medical history of Broken ribs, Collapse of left lung, Dental caries, H/O chest tube placement, and History of blood transfusion.  has a past surgical history that includes Splenectomy, total and Appendectomy. Prior to Admission medications   Medication Sig Start Date End Date Taking? Authorizing Provider  chlorhexidine  (HIBICLENS ) 4 % external liquid Apply topically daily as needed. Dilute 10-15 mL in water, Use daily when bathing for 1-2 weeks 07/02/19   Van Knee, MD  citalopram (CELEXA) 40 MG tablet Take 40 mg by mouth daily.    [provider]  ibuprofen  (ADVIL ) 600 MG tablet Take 1 tablet (600  mg total) by mouth every 6 (six) hours as needed. 07/02/19   Van Knee, MD   Allergies  Allergen Reactions   Tramadol Nausea And Vomiting   Morphine  And Codeine Rash    Pt arm turned bright red along the vein in his hand but did not itch.    FAMILY HISTORY:  family history includes Diabetes in his father. SOCIAL HISTORY:  reports that he has been smoking. He has never used smokeless tobacco. He reports that he does not drink alcohol and does not use drugs.     VITAL SIGNS: @VSRANGES @     Review of Systems: Gen:  Denies  fever, sweats, chills weight loss  HEENT: Denies blurred vision, double vision, ear pain, eye pain, hearing loss, nose bleeds, sore throat Cardiac:  No dizziness, chest pain or heaviness, chest tightness,edema, No JVD Resp:   No cough, -sputum production, -shortness of breath,-wheezing, -hemoptysis,  Other:  All other systems negative   Physical Examination:   General Appearance: No distress  EYES PERRLA, EOM intact.   NECK Supple, No JVD Pulmonary: normal breath sounds, No wheezing.  CardiovascularNormal S1,S2.  No m/r/g.   Abdomen: Benign, Soft, non-tender. Neurology UE/LE 5/5 strength, no focal deficits Ext pulses intact, cap refill intact ALL OTHER ROS ARE NEGATIVE     ASSESSMENT AND PLAN SYNOPSIS  Patient with signs and symptoms of excessive daytime sleepiness with probable underlying diagnosis of obstructive sleep apnea in the setting of obesity and deconditioned state   Recommend Sleep Study for definitve diagnosis  Obesity -recommend significant weight loss -recommend changing diet  Deconditioned state -Recommend increased daily activity and exercise  MEDICATION ADJUSTMENTS/LABS AND TESTS ORDERED: Recommend Sleep Study Recommend weight loss   CURRENT MEDICATIONS REVIEWED AT LENGTH WITH PATIENT TODAY   Patient  satisfied with Plan of action and management. All questions answered   Follow up    I spent a total  of *** minutes dedicated to the care of this patient on the date of this encounter to include pre-visit review of records, face-to-face time with the patient discussing conditions above, post visit ordering of testing, clinical documentation with the electronic health record, making appropriate referrals as documented, and communicating necessary information to the patient's healthcare team.     Nickolas Alm Cellar, M.D.  [facility]  Medical Director [facility] Medical Director [facility] Cardio-Pulmonary Department

## 2024-02-26 ENCOUNTER — Encounter: Payer: Self-pay | Admitting: Internal Medicine
